# Patient Record
Sex: Female | Born: 1970 | Race: Black or African American | Hispanic: No | Marital: Single | Smoking: Never smoker
Health system: Southern US, Community
[De-identification: ages and names within clinical notes are randomized; demographics above are authoritative.]

## PROBLEM LIST (undated history)

## (undated) DIAGNOSIS — T7840XA Allergy, unspecified, initial encounter: Secondary | ICD-10-CM

## (undated) DIAGNOSIS — R011 Cardiac murmur, unspecified: Secondary | ICD-10-CM

## (undated) DIAGNOSIS — Z83719 Family history of colon polyps, unspecified: Secondary | ICD-10-CM

## (undated) DIAGNOSIS — K219 Gastro-esophageal reflux disease without esophagitis: Secondary | ICD-10-CM

## (undated) DIAGNOSIS — Z8371 Family history of colonic polyps: Secondary | ICD-10-CM

## (undated) DIAGNOSIS — Z9889 Other specified postprocedural states: Secondary | ICD-10-CM

## (undated) HISTORY — PX: TENOLYSIS FOREARM / WRIST: SUR1332

## (undated) HISTORY — DX: Family history of colonic polyps: Z83.71

## (undated) HISTORY — DX: Family history of colon polyps, unspecified: Z83.719

## (undated) HISTORY — DX: Gastro-esophageal reflux disease without esophagitis: K21.9

## (undated) HISTORY — DX: Cardiac murmur, unspecified: R01.1

## (undated) HISTORY — PX: WISDOM TOOTH EXTRACTION: SHX21

## (undated) HISTORY — DX: Allergy, unspecified, initial encounter: T78.40XA

## (undated) HISTORY — PX: INNER EAR SURGERY: SHX679

---

## 2009-04-13 DIAGNOSIS — Z01419 Encounter for gynecological examination (general) (routine) without abnormal findings: Secondary | ICD-10-CM | POA: Insufficient documentation

## 2009-08-07 DIAGNOSIS — Z309 Encounter for contraceptive management, unspecified: Secondary | ICD-10-CM | POA: Insufficient documentation

## 2017-04-06 ENCOUNTER — Ambulatory Visit (INDEPENDENT_AMBULATORY_CARE_PROVIDER_SITE_OTHER): Payer: 59 | Admitting: Family Medicine

## 2017-04-06 ENCOUNTER — Encounter: Payer: Self-pay | Admitting: Family Medicine

## 2017-04-06 ENCOUNTER — Other Ambulatory Visit: Payer: Self-pay

## 2017-04-06 VITALS — BP 114/78 | HR 81 | Temp 98.9°F | Ht 65.5 in | Wt 222.0 lb

## 2017-04-06 DIAGNOSIS — J302 Other seasonal allergic rhinitis: Secondary | ICD-10-CM

## 2017-04-06 DIAGNOSIS — R635 Abnormal weight gain: Secondary | ICD-10-CM | POA: Diagnosis not present

## 2017-04-06 DIAGNOSIS — Z7689 Persons encountering health services in other specified circumstances: Secondary | ICD-10-CM

## 2017-04-06 DIAGNOSIS — B029 Zoster without complications: Secondary | ICD-10-CM | POA: Insufficient documentation

## 2017-04-06 DIAGNOSIS — K219 Gastro-esophageal reflux disease without esophagitis: Secondary | ICD-10-CM | POA: Insufficient documentation

## 2017-04-06 NOTE — Patient Instructions (Addendum)
It was nice meeting you today Kristen White!  Today, we referred you to [facility] for a mammogram and ultrasound.  Call to see if they can do the ultrasound and mammogram on the same day.  You will need to wait until age 47 for the Shingrix shot and your colonoscopy.  Shingles does give you extra immunity for 3 years.    We will obtain records about your previous pap smear.  If you are due, we will do that this year.  If not, we will see you back in about one year.  Good luck with your exercise plan - see if you can get [facility] a [facility] [employer] and [place] in your [place].  If you have any questions or concerns, please feel free to call the clinic.   Be well,  Dr. Frances FurbishWinfrey
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
## 2017-04-07 ENCOUNTER — Encounter: Payer: Self-pay | Admitting: Family Medicine

## 2017-04-07 DIAGNOSIS — H905 Unspecified sensorineural hearing loss: Secondary | ICD-10-CM | POA: Insufficient documentation

## 2017-04-09 DIAGNOSIS — R635 Abnormal weight gain: Secondary | ICD-10-CM | POA: Insufficient documentation

## 2017-04-09 NOTE — Assessment & Plan Note (Signed)
Supported and encouraged patient's plan of using her [employer] membership to swim more.  Also recommended that patient bring healthy snacks with her to work and to check out group exercise classes to see if any of them may work for her schedule.  Also recommended going walking outside about 3 times per week now that the weather is getting warmer.

## 2017-04-09 NOTE — Assessment & Plan Note (Signed)
-   will order diagnostic mammogram with ultrasound due to patient's history of polycystic breasts.  Can change this plan in the future depending on radiologist's read and recommendations - patient has no family hx of colon cancer and no other types of increased risk, so she will need to wait until age 47 for a colonoscopy - will also need to wait until age 47 for Shingrix; however, infection with Shingles last year will boost her immunity for about three years - will obtain records to see when her last pap was, what the results were, and whether she got HPV cotesting at that time; if due, will schedule a visit for pap with HPV cotesting

## 2017-05-16 ENCOUNTER — Other Ambulatory Visit: Payer: Self-pay | Admitting: Family Medicine

## 2017-05-16 DIAGNOSIS — N6320 Unspecified lump in the left breast, unspecified quadrant: Secondary | ICD-10-CM

## 2017-05-23 ENCOUNTER — Ambulatory Visit
Admission: RE | Admit: 2017-05-23 | Discharge: 2017-05-23 | Disposition: A | Discharge status: Home / Self Care | Payer: 59 | Source: Ambulatory Visit | Attending: Family Medicine | Admitting: Family Medicine

## 2017-05-23 DIAGNOSIS — R922 Inconclusive mammogram: Secondary | ICD-10-CM | POA: Diagnosis not present

## 2017-05-23 DIAGNOSIS — N6320 Unspecified lump in the left breast, unspecified quadrant: Secondary | ICD-10-CM | POA: Diagnosis not present

## 2017-08-14 ENCOUNTER — Ambulatory Visit (HOSPITAL_COMMUNITY)
Admission: EM | Admit: 2017-08-14 | Discharge: 2017-08-14 | Disposition: A | Discharge status: Home / Self Care | Payer: 59 | Attending: Family Medicine | Admitting: Family Medicine

## 2017-08-14 ENCOUNTER — Other Ambulatory Visit: Payer: Self-pay | Admitting: Family Medicine

## 2017-08-14 ENCOUNTER — Telehealth: Payer: Self-pay

## 2017-08-14 ENCOUNTER — Encounter (HOSPITAL_COMMUNITY): Payer: Self-pay | Admitting: Emergency Medicine

## 2017-08-14 DIAGNOSIS — Z8249 Family history of ischemic heart disease and other diseases of the circulatory system: Secondary | ICD-10-CM | POA: Insufficient documentation

## 2017-08-14 DIAGNOSIS — Z79899 Other long term (current) drug therapy: Secondary | ICD-10-CM | POA: Insufficient documentation

## 2017-08-14 DIAGNOSIS — K219 Gastro-esophageal reflux disease without esophagitis: Secondary | ICD-10-CM | POA: Diagnosis not present

## 2017-08-14 DIAGNOSIS — Z113 Encounter for screening for infections with a predominantly sexual mode of transmission: Secondary | ICD-10-CM

## 2017-08-14 DIAGNOSIS — H905 Unspecified sensorineural hearing loss: Secondary | ICD-10-CM | POA: Insufficient documentation

## 2017-08-14 LAB — RPR: RPR Ser Ql: NONREACTIVE

## 2017-08-14 LAB — HIV ANTIBODY (ROUTINE TESTING W REFLEX): HIV SCREEN 4TH GENERATION: NONREACTIVE

## 2017-08-14 MED ORDER — MEDROXYPROGESTERONE ACETATE 150 MG/ML IM SUSP: 150.0000 mg | Freq: Once | INTRAMUSCULAR | 0 refills | Status: DC

## 2017-08-14 MED FILL — medroxyPROGESTERone ACETATE: 150 | 90 days supply | Qty: 1 | Fill #0

## 2017-08-14 NOTE — Telephone Encounter (Signed)
Called phone number listed for patient and left VM relaying that I will go ahead and prescribe depo provera to be picked up at [facility] since patient would like to self administer.  She was told that if she has questions to please call the clinic.

## 2017-08-14 NOTE — ED Triage Notes (Signed)
PT here for STD testing, no symptoms. Wants to be back on birth control that she was on several years ago. Was on Depo shot

## 2017-08-14 NOTE — Discharge Instructions (Signed)
Will notify you of any positive findings and if any changes to treatment are needed.   You may also monitor on your MyChart for your results.  May follow up with your PCP and/or gynecologist, [facility] etc as needed for birth control management.  Use condoms to prevent pregnancy and STD's.

## 2017-08-14 NOTE — ED Provider Notes (Signed)
[facility]    CSN: [id number] Arrival date & time: 08/14/17  0802     History   Chief Complaint Chief Complaint  Patient presents with  . SEXUALLY TRANSMITTED DISEASE  . Contraception    HPI Kristen White is a 47 y.o. female.   Kristen White presents with requests for STD screening. States has not been sexually active in some time and is in a new relationship, would like screening prior to sexual activity with new partner. Denies any previous STD's. Denies any symptoms, denies vaginal symptoms, urinary symptoms, abdominal pain, back pain. States she would also like to be started on Depo, was on this in the past. LMP 6/21. No vaginal bleeding. Without contributing medical history.      ROS per HPI.      Past Medical History:  Diagnosis Date  . Allergy     Patient Active Problem List   Diagnosis Date Noted  . Encounter to establish care with new doctor 04/09/2017  . Weight gain 04/09/2017  . Sensorineural hearing loss (SNHL) of right ear 04/07/2017  . Seasonal allergies 04/06/2017  . Acid reflux 04/06/2017  . Shingles 04/06/2017    Class: History of    Past Surgical History:  Procedure Laterality Date  . TENOLYSIS FOREARM / WRIST      OB History   None      Home Medications    Prior to Admission medications   Medication Sig Start Date End Date Taking? Authorizing Provider  cetirizine (ZYRTEC) 10 MG tablet Take 10 mg by mouth daily.   Yes [provider]    Family History Family History  Problem Relation Age of Onset  . Early death Mother   . Hypertension Father   . Alcohol abuse Maternal Uncle   . Alcohol abuse Paternal Uncle     Social History Social History   Tobacco Use  . Smoking status: Never Smoker  . Smokeless tobacco: Never Used  Substance Use Topics  . Alcohol use: Not on file    Comment: rare  . Drug use: Never     Allergies   Patient has no known allergies.   Review of Systems Review of  Systems   Physical Exam Triage Vital Signs ED Triage Vitals  Enc Vitals Group     BP 08/14/17 0835 130/87     Pulse Rate 08/14/17 0830 76     Resp 08/14/17 0830 16     Temp 08/14/17 0830 98.6 F (37 C)     Temp Source 08/14/17 0830 Oral     SpO2 08/14/17 0830 100 %     Weight 08/14/17 0828 220 lb (99.8 kg)     Height --      Head Circumference --      Peak Flow --      Pain Score 08/14/17 0827 0     Pain Loc --      Pain Edu? --      Excl. in GC? --    No data found.  Updated Vital Signs BP 130/87   Pulse 76   Temp 98.6 F (37 C) (Oral)   Resp 16   Wt 220 lb (99.8 kg)   LMP 07/07/2017   SpO2 100%   BMI 36.05 kg/m    Physical Exam  Constitutional: She is oriented to person, place, and time. She appears well-developed and well-nourished. No distress.  Cardiovascular: Normal rate, regular rhythm and normal heart sounds.  Pulmonary/Chest: Effort normal and breath sounds normal.  Neurological: She is alert and oriented to person, place, and time.  Skin: Skin is warm and dry.     UC Treatments / Results  Labs (all labs ordered are listed, but only abnormal results are displayed) Labs Reviewed  HIV ANTIBODY (ROUTINE TESTING)  RPR  HEPATITIS C ANTIBODY  CERVICOVAGINAL ANCILLARY ONLY    EKG None  Radiology No results found.  Procedures Procedures (including critical care time)  Medications Ordered in UC Medications - No data to display  Initial Impression / Assessment and Plan / UC Course  I have reviewed the triage vital signs and the nursing notes.  Pertinent labs & imaging results that were available during my care of the patient were reviewed by me and considered in my medical decision making (see chart for details).     No symptoms today. Vaginal self swab collected. HIV/RPR/Hep C collected. Labs pending. Will notify of any positive findings and if any changes to treatment are needed.  Encouraged use of condoms for safe sex practices and for  birth control until can be started on depo. To follow up with PCP and/or gyn for depo initiation. Patient verbalized understanding and agreeable to plan.    Final Clinical Impressions(s) / UC Diagnoses   Final diagnoses:  Screen for STD (sexually transmitted disease)     Discharge Instructions     Will notify you of any positive findings and if any changes to treatment are needed.   You may also monitor on your MyChart for your results.  May follow up with your PCP and/or gynecologist, [facility] etc as needed for birth control management.  Use condoms to prevent pregnancy and STD's.    ED Prescriptions    None     Controlled Substance Prescriptions [place] Controlled Substance Registry consulted? Not Applicable   Georgetta Haber, NP 08/14/17 [phone]

## 2017-08-14 NOTE — Telephone Encounter (Signed)
Patient dropped by. She has been to urgent care today for STI testing and because she wants to start back on Depo and had a problem getting a timely appt here. Unfortunately, UC will not prescribe birth control.  Patient had physical with PCP in 03/2017. Had STI testing today. Wants to know if PCP will prescribe Depo. Has been on in past for 12 years and wants to resume. Patient non-compliant with pills per her admission. 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  She can self-administer if PCP will prescribe to [facility]. She will come here for injections if necessary.    Please call her with response at number listed in chart.   Ples SpecterAlisa Brake, RN [facility] RN)

## 2017-08-15 LAB — HEPATITIS C ANTIBODY: HCV Ab: <0.1 s/co ratio (ref 0.0–0.9)

## 2017-08-16 LAB — CERVICOVAGINAL ANCILLARY ONLY
Bacterial vaginitis: POSITIVE — AB
CANDIDA VAGINITIS: NEGATIVE
CHLAMYDIA, DNA PROBE: NEGATIVE
NEISSERIA GONORRHEA: NEGATIVE
Trichomonas: NEGATIVE

## 2017-08-17 ENCOUNTER — Telehealth (HOSPITAL_COMMUNITY): Payer: Self-pay

## 2017-08-17 MED ORDER — METRONIDAZOLE 500 MG PO TABS: 500.0000 mg | ORAL_TABLET | Freq: Two times a day (BID) | ORAL | 0 refills | Status: DC

## 2017-08-17 MED FILL — metroNIDAZOLE 500 MG TABS: 500 | 7 days supply | Qty: 14 | Fill #0

## 2017-08-17 NOTE — Telephone Encounter (Signed)
Bacterial vaginosis is positive. This was not treated at the urgent care visit.  Flagyl 500 mg BID x 7 days #14 no refills sent to patients pharmacy of choice.  Attempted to reach patient. No answer at this time. 

## 2017-08-22 ENCOUNTER — Telehealth (HOSPITAL_COMMUNITY): Payer: Self-pay | Admitting: Family Medicine

## 2017-08-22 MED ORDER — CLINDAMYCIN HCL 300 MG PO CAPS: 300.0000 mg | ORAL_CAPSULE | Freq: Three times a day (TID) | ORAL | 0 refills | Status: DC

## 2017-08-22 NOTE — Telephone Encounter (Signed)
Patient presented to the urgent care center with a complaint of vertigo.  She states she had "vertigo" every time she took a metronidazole pill.  She is here requesting an alternate medication for her BV.  She is not allergic to clindamycin.  I sent a prescription to her pharmacy.  She is told to stop the metronidazole.  Follow-up as needed

## 2017-08-23 MED FILL — CLINDAMYCIN HCL 300 MG CAP: 300 | 7 days supply | Qty: 21 | Fill #0

## 2017-11-06 ENCOUNTER — Other Ambulatory Visit: Payer: Self-pay

## 2017-11-06 MED ORDER — MEDROXYPROGESTERONE ACETATE 150 MG/ML IM SUSP: 150.0000 mg | INTRAMUSCULAR | 3 refills | Status: DC

## 2017-11-06 NOTE — Telephone Encounter (Signed)
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 Needs new Rx sent to pharmacy with refills if possible.  Call back is [phone]  Ples Specter, RN [facility] RN)

## 2017-11-07 MED FILL — medroxyPROGESTERone ACETATE: 150 | 84 days supply | Qty: 1 | Fill #0

## 2017-12-22 ENCOUNTER — Other Ambulatory Visit: Payer: Self-pay

## 2017-12-22 ENCOUNTER — Encounter: Payer: Self-pay | Admitting: Family Medicine

## 2017-12-22 ENCOUNTER — Ambulatory Visit (INDEPENDENT_AMBULATORY_CARE_PROVIDER_SITE_OTHER): Payer: 59 | Admitting: Family Medicine

## 2017-12-22 VITALS — BP 110/74 | HR 90 | Temp 98.8°F | Ht 66.0 in | Wt 226.4 lb

## 2017-12-22 DIAGNOSIS — Z0000 Encounter for general adult medical examination without abnormal findings: Secondary | ICD-10-CM | POA: Diagnosis not present

## 2017-12-22 NOTE — Patient Instructions (Signed)
It was nice seeing you today Ms. Hoefling!  We will work on your forms and let you know when they're ready.  We will try to do your pap smear around next month.  If you have any questions or concerns, please feel free to call the clinic.   Be well,  Dr. Frances FurbishWinfrey

## 2017-12-22 NOTE — Progress Notes (Signed)
   Subjective:    Kristen White - 47 y.o. female MRN [id number]  Date of birth: [birth year]  CC forms for nursing classes  HPI  Kristen White is here for review of necessary vaccinations for nursing classes.  She is earning her bachelor degree in nursing since this is a new requirement for [employer] health.  She has no other concerns today.  Her only current medications include Zyrtec and Depo Provera.  She is currently going to swimming classes for exercise.  Health Maintenance:  Patient thinks she is due for her Pap smear and usually gets it around her birthday Health Maintenance Due  Topic Date Due  . PAP SMEAR  02/13/1993    -  reports that she has never smoked. She has never used smokeless tobacco. - Review of Systems: Per HPI. - Past Medical History: Patient Active Problem List   Diagnosis Date Noted  . Healthcare maintenance 12/23/2017  . Weight gain 04/09/2017  . Sensorineural hearing loss (SNHL) of right ear 04/07/2017  . Seasonal allergies 04/06/2017  . Acid reflux 04/06/2017  . Shingles 04/06/2017    Class: History of   - Medications: reviewed and updated   Objective:   Physical Exam BP 110/74   Pulse 90   Temp 98.8 F (37.1 C) (Oral)   Ht '5\' 6"'$  (1.676 m)   Wt 226 lb 6.4 oz (102.7 kg)   SpO2 98%   BMI 36.54 kg/m  Gen: NAD, alert, cooperative with exam, well-appearing, pleasant CV: RRR, good S1/S2, no murmur Resp: CTABL, no wheezes, non-labored Skin: no rashes, normal turgor  Psych: good insight, alert and oriented        Assessment & Plan:   Healthcare maintenance Will obtain a varicella titer since patient had chickenpox as a child and an MMR titer since patient received a booster vaccination in 2012 due to equivocal titers at that time.  We will also obtain TB QuantiFERON gold test so that patient does not have to come back and to get a PPD.  Her vaccinations appear to be complete.  Will fill out her required forms for class.  We will plan on  doing patient's Pap smear next month.    Maia Breslow, M.D. 12/23/2017, 9:42 PM PGY-2, [place]

## 2017-12-23 DIAGNOSIS — Z0000 Encounter for general adult medical examination without abnormal findings: Secondary | ICD-10-CM | POA: Insufficient documentation

## 2017-12-23 NOTE — Assessment & Plan Note (Addendum)
Will obtain a varicella titer since patient had chickenpox as a child and an MMR titer since patient received a booster vaccination in 2012 due to equivocal titers at that time.  We will also obtain TB QuantiFERON gold test so that patient does not have to come back and to get a PPD.  Her vaccinations appear to be complete.  Will fill out her required forms for class.  We will plan on doing patient's Pap smear next month.

## 2017-12-25 ENCOUNTER — Telehealth: Payer: Self-pay | Admitting: *Deleted

## 2017-12-25 ENCOUNTER — Ambulatory Visit (INDEPENDENT_AMBULATORY_CARE_PROVIDER_SITE_OTHER): Payer: 59

## 2017-12-25 DIAGNOSIS — Z23 Encounter for immunization: Secondary | ICD-10-CM | POA: Diagnosis not present

## 2017-12-25 NOTE — Telephone Encounter (Signed)
-----  Message from Kathrene Alu, MD sent at 12/24/2017 12:14 AM EST ----- Regarding: Vaccinations for Evalena Fujii It looks like Ms. Branscom is not immune to [place], so she will need to schedule a nurse visit this week to get an MMR booster.  We also need her to tell us what date she got her flu shot this year.  Would you call her and let her know about this?  Once she gets the booster, tells [place] the date of her flu shot, and we get the results of her quant gold, I will have her forms ready for her up front.

## 2017-12-25 NOTE — Telephone Encounter (Signed)
FYI...Flu shot and MMR documented in Epic. Sharon T Saunders, CMA  

## 2017-12-25 NOTE — Progress Notes (Signed)
Pt presents in nurse clinic for MMR vaccine. Vaccin given RA, site unremarkable. Epic and [facility] updated.

## 2017-12-25 NOTE — Telephone Encounter (Signed)
LVM for pt to call office back to inform her of below and get the below information needed,  and to assist her in getting a nurse visit scheduled. [name], April D, [place]

## 2017-12-25 NOTE — Telephone Encounter (Signed)
Pt called nurse line returning Aprils call. I informed her of below.   Pt coming in now for MMR booster.   Her flu shot was given to her on 10/29/2017, per documentation in [place].

## 2017-12-25 NOTE — Telephone Encounter (Signed)
My fault, April documented that flu shot, still unsure of the date. I informed patient to call us when she can contact health at work for the actual date of flu shot.

## 2017-12-27 ENCOUNTER — Telehealth: Payer: Self-pay | Admitting: Family Medicine

## 2017-12-27 NOTE — Telephone Encounter (Signed)
Patient came in to report that she had her flu shot on 10/15/2017

## 2017-12-29 LAB — VARICELLA ZOSTER ANTIBODY, IGG: Varicella zoster IgG: 941 index (ref >165)

## 2017-12-29 LAB — QUANTIFERON-TB GOLD PLUS
QUANTIFERON NIL VALUE: 0.04 [IU]/mL
QUANTIFERON TB1 AG VALUE: 0.04 [IU]/mL
QUANTIFERON TB2 AG VALUE: 0.04 [IU]/mL
QUANTIFERON-TB GOLD PLUS: NEGATIVE

## 2017-12-29 LAB — MEASLES/MUMPS/RUBELLA IMMUNITY
MUMPS ABS, IGG: 15.7 AU/mL (ref >10.9)
RUBELLA: 2.55 {index} (ref >0.99)
RUBEOLA AB, IGG: <13.5 AU/mL — ABNORMAL LOW (ref >16.4)

## 2017-12-29 NOTE — Telephone Encounter (Signed)
Left a voicemail for Ms. Laural BenesJohnson saying that her immunization forms are completed and ready to be picked up in the front of the office.

## 2018-01-26 ENCOUNTER — Ambulatory Visit (INDEPENDENT_AMBULATORY_CARE_PROVIDER_SITE_OTHER): Payer: 59 | Admitting: Family Medicine

## 2018-01-26 VITALS — BP 126/70 | HR 105 | Temp 98.7°F | Wt 225.0 lb

## 2018-01-26 DIAGNOSIS — M7542 Impingement syndrome of left shoulder: Secondary | ICD-10-CM | POA: Diagnosis not present

## 2018-01-26 NOTE — Patient Instructions (Signed)
It was great to meet you today,   You have a rotator cuff impingement.  It is probably from the repetitive overhead movement when you swim.  I would advise you to take a break from a swimming lesson or two to give it some rest.  Also, you can manage the pain and inflammation with tylenol and NSAIDs like ibuoprofen.  You can get these over the counter.  You can alternate tylenol and ibuoprofen every three hours so that you are taking each one every six hours.  You can also put ice on it while you are resting.  If ice doesn't help you can try heat.  I have given you a sheet of paper with exercises on it that you can do at home that will also strengthen your shoulder.  We can refer you to physical therapy for a few sessions if you would like but you should be able to improve without it.   If the pain still persists in one month you can make another appointment to follow up with us.     Shoulder Impingement Syndrome  Shoulder impingement syndrome is a condition that causes pain when connective tissues (tendons) surrounding the shoulder joint become pinched. These tendons are part of the group of muscles and tissues that help to stabilize the shoulder (rotator cuff). Beneath the rotator cuff is a fluid-filled sac (bursa) that allows the muscles and tendons to glide smoothly. The bursa may become swollen or irritated (bursitis). Bursitis, swelling in the rotator cuff tendons, or both conditions can decrease how much space is under a bone in the shoulder joint (acromion), resulting in impingement. What are the causes? Shoulder impingement syndrome may be caused by bursitis or swelling of the rotator cuff tendons, which may result from:  Repetitive overhead arm movements.  Falling onto the shoulder.  Weakness in the shoulder muscles. What increases the risk? You may be more likely to develop this condition if you:  Play sports that involve throwing, such as baseball.  Participate in sports such as  tennis, volleyball, and swimming.  Work as a Education administratorpainter, Music therapistcarpenter, or Pharmacologistfruit picker. Some people are also more likely to develop impingement syndrome because of the shape of their acromion bone. What are the signs or symptoms? The main symptom of this condition is pain on the front or side of the shoulder. The pain may:  Get worse when lifting or raising the arm.  Get worse at night.  Wake you up from sleeping.  Feel sharp when the shoulder is moved and then fade to an ache. Other symptoms may include:  Tenderness.  Stiffness.  Inability to raise the arm above shoulder level or behind the body.  Weakness. How is this diagnosed? This condition may be diagnosed based on:  Your symptoms and medical history.  A physical exam.  Imaging tests, such as: ? X-rays. ? MRI. ? Ultrasound. How is this treated? This condition may be treated by:  Resting your shoulder and avoiding all activities that cause pain or put stress on the shoulder.  Icing your shoulder.  NSAIDs to help reduce pain and swelling.  One or more injections of medicines to numb the area and reduce inflammation.  Physical therapy.  Surgery. This may be needed if nonsurgical treatments have not helped. Surgery may involve repairing the rotator cuff, reshaping the acromion, or removing the bursa. Follow these instructions at home: Managing pain, stiffness, and swelling   If directed, put ice on the injured area. ? Put ice  in a plastic bag. ? Place a towel between your skin and the bag. ? Leave the ice on for 20 minutes, 2-3 times a day. Activity  Rest and return to your normal activities as told by your health care provider. Ask your health care provider what activities are safe for you.  Do exercises as told by your health care provider. General instructions  Do not use any products that contain nicotine or tobacco, such as cigarettes, e-cigarettes, and chewing tobacco. These can delay healing. If you  need help quitting, ask your health care provider.  Ask your health care provider when it is safe for you to drive.  Take over-the-counter and prescription medicines only as told by your health care provider.  Keep all follow-up visits as told by your health care provider. This is important. How is this prevented?  Give your body time to rest between periods of activity.  Be safe and responsible while being active. This will help you avoid falls.  Maintain physical fitness, including strength and flexibility. Contact a health care provider if:  Your symptoms have not improved after 1-2 months of treatment and rest.  You cannot lift your arm away from your body. Summary  Shoulder impingement syndrome is a condition that causes pain when connective tissues (tendons) surrounding the shoulder joint become pinched.  The main symptom of this condition is pain on the front or side of the shoulder.  This condition is usually treated with rest, ice, and pain medicines as needed. This information is not intended to replace advice given to you by your health care provider. Make sure you discuss any questions you have with your health care provider. Document Released: 01/03/2005 Document Revised: 06/28/2017 Document Reviewed: 06/28/2017 Elsevier Interactive Patient Education  [employer].

## 2018-01-26 NOTE — Progress Notes (Signed)
   [facility] Phone: [phone]   cc: left shoulder pain  Subjective:  She is having left shoulder pain. It Started about a week ago. She was getting dressed in the morning when she first noticed it.  nothing triggered it.  Hasn't fallen or doing heavy lifting.  Started swimming in October.  Swims about twice a week for 45-23min.  Her freestyle is difficult bc of her shoulder, backstroke isnot as bad.  It hurts when she lifts her should past parallel.  She is not having neck pain and pain does not radiate to her arm.      She describes the pain like something is 'stretching'. Can be aching if she holds it in the position that hurts.  The pain got worse over the first two days but has been constant since.   She has had mild knee pain in the past but that is her only other MSK pain. .    Patient only takes zyrtec and gets the depo shot.     ROS: See HPI for pertinent positives and negatives  Past Medical History  Family history reviewed for today's visit. No changes.   Objective: BP 126/70   Pulse (!) 105   Temp 98.7 F (37.1 C)   Wt 225 lb (102.1 kg)   SpO2 98%   BMI 36.32 kg/m  Gen: NAD, alert and oriented, cooperative with exam HEENT: NCAT, EOMI, MMM Msk: No edema, warm, normal tone.  There is no deficits in the right UE, or b/l LE's.  No tenderness to palpation.  Active ROM slowed but full on left upper extremity.  Empty can test negative.  Push off test negative.  Full can test positive on left shoulder.  Neuro: CN II-XII grossly intact. no gross deficits Skin: No rashes, no lesions Psych: Appropriate behavior  Assessment/Plan: Rotator cuff impingement syndrome of left shoulder The patient is having left shoulder pain that bothers her mostly with overhead movement.  No recent trauma.  Likely supraspinatus injury from repetitive overhead movements from swimming, which she started in October.  Empty can test negative, push off test negative, full can  test positive. Range of motion impaired by pain. Told patient to alternate tylenol and nsaids, to apply ice, heat if ice does not help, to avoid overusing the shoulder, and gave her some home exercises to try.  Advised patient to followup in 4 weeks if pain not improved.  Can refer to PT outpatient in two weeks if not improved or getting worse.  Frederic Jericho, MD PGY-1

## 2018-01-26 NOTE — Assessment & Plan Note (Addendum)
The patient is having left shoulder pain that bothers her mostly with overhead movement.  No recent trauma.  Likely supraspinatus injury from repetitive overhead movements from swimming, which she started in October.  Empty can test negative, push off test negative, full can test positive. Range of motion impaired by pain. Told patient to alternate tylenol and nsaids, to apply ice, heat if ice does not help, to avoid overusing the shoulder, and gave her some home exercises to try.  Advised patient to followup in 4 weeks if pain not improved.  Can refer to PT outpatient in two weeks if not improved or getting worse.  Kristen White

## 2018-02-05 MED FILL — medroxyPROGESTERone ACETATE: 150 | 84 days supply | Qty: 1 | Fill #1

## 2018-02-23 ENCOUNTER — Ambulatory Visit (INDEPENDENT_AMBULATORY_CARE_PROVIDER_SITE_OTHER): Payer: 59 | Admitting: Family Medicine

## 2018-02-23 ENCOUNTER — Other Ambulatory Visit: Payer: Self-pay

## 2018-02-23 ENCOUNTER — Encounter: Payer: Self-pay | Admitting: Family Medicine

## 2018-02-23 VITALS — BP 118/76 | HR 88 | Temp 99.0°F | Wt 222.0 lb

## 2018-02-23 DIAGNOSIS — M7542 Impingement syndrome of left shoulder: Secondary | ICD-10-CM | POA: Diagnosis not present

## 2018-02-23 NOTE — Progress Notes (Signed)
   Subjective:    Kristen White - 48 y.o. female MRN [id number]  Date of birth: 1970/07/12  CC:  Kristen White is here for follow up of L shoulder pain.  HPI: Her shoulder pain continues to be of the same intensity as when she saw Dr. Constance Goltz on January 10.  The pain is not constant, as it only hurts with doing certain movements, such as reaching overhead or doing the freestyle during swimming.  She has tried to rest the left shoulder, has followed the shoulder exercises Dr. Constance Goltz provided for her, and has used ice and heat, but she has not tried any NSAIDs or Tylenol because her pain is not constant.  She wonders if her pain could be caused by constantly using her left hand when holding the phone up to her ear since she is deaf in her right ear.  She reports that she has full range of motion and does not have any weakness, but her movement is restricted by pain.  Health Maintenance:  Health Maintenance Due  Topic Date Due  . PAP SMEAR-Modifier  02/13/1993    -  reports that she has never smoked. She has never used smokeless tobacco. - Review of Systems: Per HPI. - Past Medical History: Patient Active Problem List   Diagnosis Date Noted  . Rotator cuff impingement syndrome of left shoulder 01/26/2018  . Healthcare maintenance 12/23/2017  . Weight gain 04/09/2017  . Sensorineural hearing loss (SNHL) of right ear 04/07/2017  . Seasonal allergies 04/06/2017  . Acid reflux 04/06/2017  . Shingles 04/06/2017    Class: History of   - Medications: reviewed and updated   Objective:   Physical Exam BP 118/76   Pulse 88   Temp 99 F (37.2 C) (Oral)   Wt 222 lb (100.7 kg)   SpO2 99%   BMI 35.83 kg/m  Gen: NAD, alert, cooperative with exam, well-appearing CV: RRR, good S1/S2, no murmur Musculoskeletal: Left shoulder nontender to palpation and appear symmetric.  Full range of motion, although abduction of left arm past 90 degrees elicits pain.  Hawkins test negative, empty can test  strongly positive. Skin: no rashes, normal turgor  Neuro: no gross deficits.  Psych: good insight, alert and oriented        Assessment & Plan:   Rotator cuff impingement syndrome of left shoulder Agree with previous diagnosis of supraspinatus injury.  Tear is highly unlikely given lack of weakness.  Counseled patient that this is a self-limiting injury that is rarely operated on.  Offered patient a referral to physical therapy, but this is likely not needed since patient is self motivated and already exercises her upper body multiple times per week.  Encouraged her to try using Tylenol or NSAIDs before swimming practice to make it more tolerable, but also encouraged her to alter her exercises so as not to exacerbate her pain.  Encouraged her to continue swimming if possible to maintain range of motion of that shoulder.  Offered a referral to orthopedic surgery but told patient that imaging and corticosteroid shots would likely not benefit her in the long-term.  Patient understood this and would like to wait on any referrals for now.    Lezlie Octave, M.D. 02/23/2018, 11:20 AM PGY-2, [facility]

## 2018-02-23 NOTE — Assessment & Plan Note (Signed)
Agree with previous diagnosis of supraspinatus injury.  Tear is highly unlikely given lack of weakness.  Counseled patient that this is a self-limiting injury that is rarely operated on.  Offered patient a referral to physical therapy, but this is likely not needed since patient is self motivated and already exercises her upper body multiple times per week.  Encouraged her to try using Tylenol or NSAIDs before swimming practice to make it more tolerable, but also encouraged her to alter her exercises so as not to exacerbate her pain.  Encouraged her to continue swimming if possible to maintain range of motion of that shoulder.  Offered a referral to orthopedic surgery but told patient that imaging and corticosteroid shots would likely not benefit her in the long-term.  Patient understood this and would like to wait on any referrals for now.

## 2018-04-09 ENCOUNTER — Other Ambulatory Visit: Payer: Self-pay | Admitting: Family Medicine

## 2018-04-09 DIAGNOSIS — Z1231 Encounter for screening mammogram for malignant neoplasm of breast: Secondary | ICD-10-CM

## 2018-05-07 MED FILL — medroxyPROGESTERone ACETATE: 150 | 84 days supply | Qty: 1 | Fill #0

## 2018-06-01 ENCOUNTER — Ambulatory Visit: Payer: 59

## 2018-06-03 ENCOUNTER — Other Ambulatory Visit: Payer: Self-pay

## 2018-06-03 ENCOUNTER — Emergency Department (HOSPITAL_COMMUNITY): Payer: 59

## 2018-06-03 ENCOUNTER — Emergency Department (HOSPITAL_COMMUNITY)
Admission: EM | Admit: 2018-06-03 | Discharge: 2018-06-04 | Disposition: A | Discharge status: Home / Self Care | Payer: 59 | Attending: Emergency Medicine | Admitting: Emergency Medicine

## 2018-06-03 ENCOUNTER — Encounter (HOSPITAL_COMMUNITY): Payer: Self-pay | Admitting: Emergency Medicine

## 2018-06-03 DIAGNOSIS — M545 Low back pain, unspecified: Secondary | ICD-10-CM

## 2018-06-03 DIAGNOSIS — R3121 Asymptomatic microscopic hematuria: Secondary | ICD-10-CM | POA: Insufficient documentation

## 2018-06-03 DIAGNOSIS — Z79899 Other long term (current) drug therapy: Secondary | ICD-10-CM | POA: Diagnosis not present

## 2018-06-03 DIAGNOSIS — R109 Unspecified abdominal pain: Secondary | ICD-10-CM | POA: Diagnosis not present

## 2018-06-03 LAB — COMPREHENSIVE METABOLIC PANEL
ALT: 14 U/L (ref 0–44)
AST: 15 U/L (ref 15–41)
Albumin: 3.9 g/dL (ref 3.5–5.0)
Alkaline Phosphatase: 62 U/L (ref 38–126)
Anion gap: 9 (ref 5–15)
BUN: 12 mg/dL (ref 6–20)
CO2: 20 mmol/L — ABNORMAL LOW (ref 22–32)
Calcium: 8.8 mg/dL — ABNORMAL LOW (ref 8.9–10.3)
Chloride: 110 mmol/L (ref 98–111)
Creatinine, Ser: 0.9 mg/dL (ref 0.44–1.00)
GFR calc Af Amer: >60 mL/min (ref >60)
GFR calc non Af Amer: >60 mL/min (ref >60)
Glucose, Bld: 104 mg/dL — ABNORMAL HIGH (ref 70–99)
Potassium: 3.4 mmol/L — ABNORMAL LOW (ref 3.5–5.1)
Sodium: 139 mmol/L (ref 135–145)
Total Bilirubin: 0.3 mg/dL (ref 0.3–1.2)
Total Protein: 7.5 g/dL (ref 6.5–8.1)

## 2018-06-03 LAB — URINALYSIS, ROUTINE W REFLEX MICROSCOPIC
Bilirubin Urine: NEGATIVE
Glucose, UA: NEGATIVE mg/dL
Ketones, ur: NEGATIVE mg/dL
Leukocytes,Ua: NEGATIVE
Nitrite: NEGATIVE
Protein, ur: NEGATIVE mg/dL
Specific Gravity, Urine: 1.02 (ref 1.005–1.030)
pH: 6 (ref 5.0–8.0)

## 2018-06-03 LAB — I-STAT BETA HCG BLOOD, ED (MC, WL, AP ONLY): I-stat hCG, quantitative: <5 m[IU]/mL (ref <5)

## 2018-06-03 LAB — CBC WITH DIFFERENTIAL/PLATELET
Abs Immature Granulocytes: 0.02 10*3/uL (ref 0.00–0.07)
Basophils Absolute: 0.1 10*3/uL (ref 0.0–0.1)
Basophils Relative: 1 %
Eosinophils Absolute: 0.3 10*3/uL (ref 0.0–0.5)
Eosinophils Relative: 4 %
HCT: 42.8 % (ref 36.0–46.0)
Hemoglobin: 13.6 g/dL (ref 12.0–15.0)
Immature Granulocytes: 0 %
Lymphocytes Relative: 39 %
Lymphs Abs: 3 10*3/uL (ref 0.7–4.0)
MCH: 27.8 pg (ref 26.0–34.0)
MCHC: 31.8 g/dL (ref 30.0–36.0)
MCV: 87.3 fL (ref 80.0–100.0)
Monocytes Absolute: 0.5 10*3/uL (ref 0.1–1.0)
Monocytes Relative: 7 %
Neutro Abs: 3.8 10*3/uL (ref 1.7–7.7)
Neutrophils Relative %: 49 %
Platelets: 314 10*3/uL (ref 150–400)
RBC: 4.9 MIL/uL (ref 3.87–5.11)
RDW: 13.7 % (ref 11.5–15.5)
WBC: 7.7 10*3/uL (ref 4.0–10.5)
nRBC: 0 % (ref 0.0–0.2)

## 2018-06-03 MED ORDER — KETOROLAC TROMETHAMINE 15 MG/ML IJ SOLN
15.0000 mg | Freq: Once | INTRAMUSCULAR | Status: AC
  Administered 2018-06-03: 15 mg via INTRAVENOUS
  Filled 2018-06-03: qty 1

## 2018-06-03 NOTE — ED Provider Notes (Signed)
[facility]-EMERGENCY DEPT Provider Note   CSN: [id number] Arrival date & time: 06/03/18  2120    History   Chief Complaint Chief Complaint  Patient presents with  . Back Pain    HPI Kristen White is a 48 y.o. female.     Patient with no past surgical history presents emergency department with acute onset of left-sided lower back pain at approximately 6 PM.  Patient reports sitting in a hammock chair earlier today however denies acute injury related to the pain.  Over the course of a couple of hours, pain gradually became worse and became severe to the point where she was having trouble walking.  Pain does not radiate to the abdomen or down into her legs.  She has not had any fevers, nausea, vomiting, chest pain, shortness of breath.  No diarrhea or bowel changes.  No noted dysuria, hematuria, increased frequency urgency.  No treatments prior to arrival.  She has never had a kidney stone.  Pain is not worse with palpation but is worse when she moves.     Past Medical History:  Diagnosis Date  . Allergy     Patient Active Problem List   Diagnosis Date Noted  . Rotator cuff impingement syndrome of left shoulder 01/26/2018  . Healthcare maintenance 12/23/2017  . Weight gain 04/09/2017  . Sensorineural hearing loss (SNHL) of right ear 04/07/2017  . Seasonal allergies 04/06/2017  . Acid reflux 04/06/2017  . Shingles 04/06/2017    Class: History of    Past Surgical History:  Procedure Laterality Date  . TENOLYSIS FOREARM / WRIST       OB History   No obstetric history on file.      Home Medications    Prior to Admission medications   Medication Sig Start Date End Date Taking? Authorizing Provider  cetirizine (ZYRTEC) 10 MG tablet Take 10 mg by mouth daily.   Yes [provider]  ibuprofen (ADVIL) 200 MG tablet Take 200 mg by mouth every 6 (six) hours as needed for headache or moderate pain.   Yes [provider]   medroxyPROGESTERone (DEPO-PROVERA) 150 MG/ML injection Inject 1 mL (150 mg total) into the muscle every 3 (three) months. 11/06/17  Yes WinfreyHarlen [facility], Amanda C, MD    Family History Family History  Problem Relation Age of Onset  . Early death Mother   . Hypertension Father   . Alcohol abuse Maternal Uncle   . Alcohol abuse Paternal Uncle     Social History Social History   Tobacco Use  . Smoking status: Never Smoker  . Smokeless tobacco: Never Used  Substance Use Topics  . Alcohol use: Not on file    Comment: rare  . Drug use: Never     Allergies   No known allergies and Metronidazole   Review of Systems Review of Systems  Constitutional: Negative for fever and unexpected weight change.  HENT: Negative for rhinorrhea.   Eyes: Negative for redness.  Respiratory: Negative for cough.   Cardiovascular: Negative for chest pain.  Gastrointestinal: Negative for abdominal pain, constipation, diarrhea, nausea and vomiting.       Negative for fecal incontinence.   Genitourinary: Negative for dysuria, flank pain and hematuria.       Negative for urinary incontinence or retention.  Musculoskeletal: Positive for back pain. Negative for myalgias.  Skin: Negative for rash.  Neurological: Negative for weakness, numbness and headaches.       Denies saddle paresthesias.  Physical Exam Updated Vital Signs BP (!) 159/97 (BP Location: Left Arm)   Pulse (!) 104   Temp 99.2 F (37.3 C) (Oral)   Resp 18   Ht  (1.651 m)   Wt 99.8 kg   SpO2 97%   BMI 36.61 kg/m   Physical Exam Vitals signs and nursing note reviewed.  Constitutional:      Appearance: She is well-developed.  HENT:     Head: Normocephalic and atraumatic.  Eyes:     Conjunctiva/sclera: Conjunctivae normal.  Neck:     Musculoskeletal: Normal range of motion and neck supple.  Pulmonary:     Effort: Pulmonary effort is normal.  Abdominal:     Palpations: Abdomen is soft.     Tenderness: There is no  abdominal tenderness. There is no guarding or rebound.  Musculoskeletal: Normal range of motion.     Cervical back: She exhibits normal range of motion, no tenderness and no bony tenderness.     Thoracic back: She exhibits normal range of motion, no tenderness and no bony tenderness.     Lumbar back: She exhibits normal range of motion, no tenderness and no bony tenderness.     Comments: No step-off noted with palpation of spine.   Skin:    General: Skin is warm and dry.     Findings: No rash.  Neurological:     Mental Status: She is alert.     Sensory: No sensory deficit.     Deep Tendon Reflexes: Reflexes are normal and symmetric.     Comments: 5/5 strength in entire lower extremities bilaterally. No sensation deficit.       ED Treatments / Results  Labs (all labs ordered are listed, but only abnormal results are displayed) Labs Reviewed  URINALYSIS, ROUTINE W REFLEX MICROSCOPIC  CBC WITH DIFFERENTIAL/PLATELET  COMPREHENSIVE METABOLIC PANEL  I-STAT BETA HCG BLOOD, ED ([facility], [facility], [facility] ONLY)    EKG None  Radiology No results found.  Procedures Procedures (including critical care time)  Medications Ordered in ED Medications - No data to display   Initial Impression / Assessment and Plan / ED Course  I have reviewed the triage vital signs and the nursing notes.  Pertinent labs & imaging results that were available during my care of the patient were reviewed by me and considered in my medical decision making (see chart for details).        Patient seen and examined. Work-up initiated.  Patient declines pain medications at this time.  Possible ureteral colic versus MSK pain.  No radicular pain.  No red flags.  Labs, UA pending.  Vital signs reviewed and are as follows: BP (!) 159/97 (BP Location: Left Arm)   Pulse (!) 104   Temp 99.2 F (37.3 C) (Oral)   Resp 18   Ht  (1.651 m)   Wt 99.8 kg   SpO2 97%   BMI 36.61 kg/m   12:03 AM Toradol ordered for pain. UA  with some red cells. Will proceed with renal protocol CT to eval for stone.   CT images personally reviewed and interpreted.  No acute findings noted on radiology read.  Patient updated on results.  She does not think that the Toradol helped a whole lot but is more comfortable lying in bed with her knees bent.  She is comfortable with discharged home at this time.  We discussed use of muscle relaxant medications, NSAIDs, ice/heat, rest.    The patient was urged to return  to the Emergency Department immediately with worsening of current symptoms, worsening abdominal pain, persistent vomiting, blood noted in stools, fever, or any other concerns. The patient verbalized understanding.    Final Clinical Impressions(s) / ED Diagnoses   Final diagnoses:  Acute left-sided low back pain without sciatica  Asymptomatic microscopic hematuria   Patient with acute onset of left lower back pain tonight.  Pain was severe at times.  She did not have any nausea, vomiting, diarrhea.  No urinary symptoms.  Evaluation in the emergency department shows reassuring CBC and CMP.  Urine significant for blood.  Renal protocol CT performed which was negative.  Patient's symptoms are relatively controlled in the emergency department and she feels well enough to go home at this time.  Will focus treatment on musculoskeletal pain at this point and have patient follow-up with her doctor for further evaluation if it is not improving.  Return instructions.   ED Discharge Orders         Ordered    methocarbamol (ROBAXIN) 500 MG tablet  4 times daily     06/04/18 0042           Renne Crigler, PA-C 06/04/18 0045    Gerhard Munch, MD 06/07/18 [id number]

## 2018-06-03 NOTE — ED Triage Notes (Signed)
Patient states she started having pain around 6pm. Patient states it is a sharp pain left lower back. She states it is constant and will not go away. It is not radiating.

## 2018-06-04 DIAGNOSIS — R3121 Asymptomatic microscopic hematuria: Secondary | ICD-10-CM | POA: Diagnosis not present

## 2018-06-04 DIAGNOSIS — Z79899 Other long term (current) drug therapy: Secondary | ICD-10-CM | POA: Diagnosis not present

## 2018-06-04 DIAGNOSIS — M545 Low back pain: Secondary | ICD-10-CM | POA: Diagnosis not present

## 2018-06-04 DIAGNOSIS — R109 Unspecified abdominal pain: Secondary | ICD-10-CM | POA: Diagnosis not present

## 2018-06-04 MED ORDER — METHOCARBAMOL 500 MG PO TABS
1000.0000 mg | ORAL_TABLET | Freq: Once | ORAL | Status: AC
  Administered 2018-06-04: 1000 mg via ORAL
  Filled 2018-06-04: qty 2

## 2018-06-04 MED ORDER — METHOCARBAMOL 500 MG PO TABS: 1000.0000 mg | ORAL_TABLET | Freq: Four times a day (QID) | ORAL | 0 refills | Status: DC

## 2018-06-04 NOTE — Discharge Instructions (Signed)
Please read and follow all provided instructions.  Your diagnoses today include:  1. Acute left-sided low back pain without sciatica   2. Asymptomatic microscopic hematuria     Tests performed today include:  Vital signs - see below for your results today  Blood counts and electrolytes  Urine test -shows signs of blood but not sign of infection  CAT scan -does not show any kidney stones or other problems  Medications prescribed:   Robaxin (methocarbamol) - muscle relaxer medication  DO NOT drive or perform any activities that require you to be awake and alert because this medicine can make you drowsy.   Take any prescribed medications only as directed.  Home care instructions:   Follow any educational materials contained in this packet  Please rest, use ice or heat on your back for the next several days  Do not lift, push, pull anything more than 10 pounds for the next week  Follow-up instructions: Please follow-up with your primary care provider in the next 3 days for further evaluation of your symptoms if they are not getting better.   Return instructions:  SEEK IMMEDIATE MEDICAL ATTENTION IF YOU HAVE:  New numbness, tingling, weakness, or problem with the use of your arms or legs  Severe back pain not relieved with medications  Loss control of your bowels or bladder  Increasing pain in any areas of the body (such as chest or abdominal pain)  Shortness of breath, dizziness, or fainting.   Worsening nausea (feeling sick to your stomach), vomiting, fever, or sweats  Any other emergent concerns regarding your health   Additional Information:  Your vital signs today were: BP (!) 159/97 (BP Location: Left Arm)    Pulse (!) 104    Temp 99.2 F (37.3 C) (Oral)    Resp 18    Ht 5\' 5"  (1.651 m)    Wt 99.8 kg    SpO2 97%    BMI 36.61 kg/m  If your blood pressure (BP) was elevated above 135/85 this visit, please have this repeated by your doctor within one  month. --------------

## 2018-06-15 ENCOUNTER — Other Ambulatory Visit: Payer: Self-pay

## 2018-06-15 ENCOUNTER — Ambulatory Visit
Admission: RE | Admit: 2018-06-15 | Discharge: 2018-06-15 | Disposition: A | Discharge status: Home / Self Care | Payer: 59 | Source: Ambulatory Visit | Attending: Family Medicine | Admitting: Family Medicine

## 2018-06-15 DIAGNOSIS — Z1231 Encounter for screening mammogram for malignant neoplasm of breast: Secondary | ICD-10-CM

## 2018-07-13 ENCOUNTER — Encounter: Payer: Self-pay | Admitting: Family Medicine

## 2018-07-13 ENCOUNTER — Ambulatory Visit (INDEPENDENT_AMBULATORY_CARE_PROVIDER_SITE_OTHER): Payer: 59 | Admitting: Family Medicine

## 2018-07-13 ENCOUNTER — Other Ambulatory Visit (HOSPITAL_COMMUNITY)
Admission: RE | Admit: 2018-07-13 | Discharge: 2018-07-13 | Disposition: A | Discharge status: Home / Self Care | Payer: 59 | Source: Ambulatory Visit | Attending: Family Medicine | Admitting: Family Medicine

## 2018-07-13 ENCOUNTER — Other Ambulatory Visit: Payer: Self-pay

## 2018-07-13 VITALS — BP 124/82 | HR 90 | Wt 228.4 lb

## 2018-07-13 DIAGNOSIS — Z124 Encounter for screening for malignant neoplasm of cervix: Secondary | ICD-10-CM | POA: Diagnosis not present

## 2018-07-13 DIAGNOSIS — Z0000 Encounter for general adult medical examination without abnormal findings: Secondary | ICD-10-CM | POA: Diagnosis not present

## 2018-07-13 DIAGNOSIS — N939 Abnormal uterine and vaginal bleeding, unspecified: Secondary | ICD-10-CM | POA: Diagnosis not present

## 2018-07-13 NOTE — Assessment & Plan Note (Signed)
Reviewed with patient the options that are available to her, and advised that she consider trying a combined contraceptive, which would either be OCPs, NuvaRing, or the patch.  We could try Nexplanon or an IUD, but unfortunately these can also come with menstrual irregularities.  She is not interested in the IUD and will consider the other options.  She plans to think about it and call me to let me know what she prefers to do.

## 2018-07-13 NOTE — Assessment & Plan Note (Signed)
Pap smear performed today.  If cytology and HPV cotesting are normal, she will be due for her next one in 5 years.

## 2018-07-13 NOTE — Progress Notes (Signed)
   Subjective:    Kristen White - 48 y.o. female MRN [id number]  Date of birth: July 27, 1970  CC:  NHYLA NAPPI is here for her Pap smear and to discuss spotting while on Depo-Provera.   HPI: Spotting while on Depo-Provera Has a history of taking Depo-Provera for 12 years without any spotting, but she restarted this medication about 9 months ago after taking a few years off of it and has had spotting ever since.  It has not improved over the span of these 9 months.  She denies pelvic pain or concern for STIs.  She is monogamous with one female partner.  Recent left shoulder pain and back pain, both resolved Patient had left shoulder pain, likely either a rotator cuff tendinopathy or impingement, but this has since improved.  She had to go to the emergency department in May 2020 after developing acute onset, severe back pain that was nontraumatic.  A CT scan was performed and blood work is performed, and no abnormalities were noted.  She was given a muscle relaxer and NSAIDs, which improved her pain.  This has completely resolved.  She plans to restart swimming as soon as she can after the pool has been close during the [place] pandemic.  Health Maintenance:  There are no preventive care reminders to display for this patient.  -  reports that she has never smoked. She has never used smokeless tobacco. - Review of Systems: Per HPI. - Past Medical History: Patient Active Problem List   Diagnosis Date Noted  . Vaginal spotting 07/13/2018  . Healthcare maintenance 12/23/2017  . Weight gain 04/09/2017  . Sensorineural hearing loss (SNHL) of right ear 04/07/2017  . Seasonal allergies 04/06/2017  . Acid reflux 04/06/2017   - Medications: reviewed and updated   Objective:   Physical Exam BP 124/82   Pulse 90   Wt 228 lb 6.4 oz (103.6 kg)   SpO2 99%   BMI 38.01 kg/m  Gen: NAD, alert, cooperative with exam, well-appearing CV: RRR, good S1/S2, no murmur, no edema Resp: CTABL, no  wheezes, non-labored Abd: SNTND, BS present, no guarding or organomegaly GU: Normal vulva, vagina, and cervix without lesions.  A small amount of dark blood is visualized in the vaginal vault. Skin: no rashes, normal turgor  Psych: good insight, alert and oriented        Assessment & Plan:   Vaginal spotting Reviewed with patient the options that are available to her, and advised that she consider trying a combined contraceptive, which would either be OCPs, NuvaRing, or the patch.  We could try Nexplanon or an IUD, but unfortunately these can also come with menstrual irregularities.  She is not interested in the IUD and will consider the other options.  She plans to think about it and call me to let me know what she prefers to do.  Healthcare maintenance Pap smear performed today.  If cytology and HPV cotesting are normal, she will be due for her next one in 5 years.    Maia Breslow, M.D. 07/13/2018, 1:56 PM PGY-2, [place]

## 2018-07-17 LAB — CYTOLOGY - PAP
Diagnosis: NEGATIVE
HPV: NOT DETECTED

## 2018-07-19 ENCOUNTER — Encounter (HOSPITAL_COMMUNITY): Payer: Self-pay | Admitting: Family Medicine

## 2018-08-06 MED FILL — medroxyPROGESTERone ACETATE: 150 | 84 days supply | Qty: 1 | Fill #0

## 2018-09-18 ENCOUNTER — Encounter: Payer: Self-pay | Admitting: Family Medicine

## 2018-10-22 ENCOUNTER — Telehealth: Payer: 59 | Admitting: Nurse Practitioner

## 2018-10-22 DIAGNOSIS — R42 Dizziness and giddiness: Secondary | ICD-10-CM | POA: Diagnosis not present

## 2018-10-22 MED ORDER — MECLIZINE HCL 25 MG PO TABS: 25.0000 mg | ORAL_TABLET | Freq: Three times a day (TID) | ORAL | 0 refills | Status: DC | PRN

## 2018-10-22 NOTE — Progress Notes (Signed)
E Visit for Motion Sickness  We are sorry that you are not feeling well. Here is how we plan to help!  Based on what you have shared with me it looks like you have symptoms of motion sickness.  I have prescribed a medication that will help prevent or alleviate your symptoms:  Meclizine 25mg by mouth three times per day as needed for nausea/motion sickness   Prevention:  You might feel better if you keep your eyes focused on outside while you are in motion. For example, if you are in a car, sit in the front and look in the direction you are moving; if you are on a boat, stay on the deck and look to the horizon. This helps make what you see match the movement you are feeling, and so you are less likely to feel sick.  You should also avoid reading, watching a movie, texting or reading messages, or looking at things close to you inside the vehicle you are riding in.  Use the seat head rest. Lean your head against the back of the seat or head rest when traveling in vehicles with seats to minimize head movements.  On a ship: When making your reservations, choose a cabin in the middle of the ship and near the waterline. When on board, go up on deck and focus on the horizon.  In an airplane: Request a window seat and look out the window. A seat over the front edge of the wing is the most preferable spot (the degree of motion is the lowest here). Direct the air vent to blow cool air on your face.  On a train: Always face forward and sit near a window.  In a vehicle: Sit in the front seat; if you are the passenger, look at the scenery in the distance. For some people, driving the vehicle (rather than being a passenger) is an instant remedy.  Avoid others who have become nauseous with motion sickness. Seeing and smelling others who have motion sickness may cause you to become sick.  GET HELP RIGHT AWAY IF:  Your symptoms do not improve or worsen within 2 days after treatment.  You cannot keep  down fluids after trying the medication.  Other associated symptoms such as severe headache, visual field changes, fever, or intractable nausea and vomiting.  MAKE SURE YOU:  Understand these instructions. Will watch your condition. Will get help right away if you are not doing well or get worse.  Thank you for choosing an e-visit.  Your e-visit answers were reviewed by a board certified advanced clinical practitioner to complete your personal care plan. Depending upon the condition, your plan could have included both over the counter or prescription medications.  Please review your pharmacy choice. Be sure that the pharmacy you have chosen is open so that you can pick up your prescription now.  If there is a problem you may message your provider in MyChart to have the prescription routed to another pharmacy.  Your safety is important to us. If you have drug allergies check your prescription carefully.   For the next 24 hours, you can use MyChart to ask questions about today's visit, request a non-urgent call back, or ask for a work or school excuse from your e-visit provider.  You will get an e-mail in the next two days asking about your experience. I hope that your e-visit has been valuable and will speed your recovery.   References or for more information: https://wwwnc.cdc.gov/travel/yellowbook/2020/travel-by-air-land-sea/motion-sickness https://my.[facility].org/health/articles/12782-motion-sickness https://www.uptodate.com     5-10 minutes spent reviewing and documenting in chart.  

## 2018-11-05 ENCOUNTER — Other Ambulatory Visit: Payer: Self-pay | Admitting: Family Medicine

## 2018-11-06 ENCOUNTER — Other Ambulatory Visit: Payer: Self-pay | Admitting: Family Medicine

## 2018-11-06 MED FILL — medroxyPROGESTERone ACETATE: 150 | 84 days supply | Qty: 1 | Fill #0

## 2018-11-29 ENCOUNTER — Encounter: Payer: Self-pay | Admitting: Family Medicine

## 2018-11-29 ENCOUNTER — Other Ambulatory Visit: Payer: Self-pay | Admitting: Family Medicine

## 2018-11-29 DIAGNOSIS — Z20828 Contact with and (suspected) exposure to other viral communicable diseases: Secondary | ICD-10-CM

## 2018-11-29 DIAGNOSIS — Z20822 Contact with and (suspected) exposure to covid-19: Secondary | ICD-10-CM

## 2018-12-19 ENCOUNTER — Telehealth: Payer: 59 | Admitting: Family

## 2018-12-19 DIAGNOSIS — J069 Acute upper respiratory infection, unspecified: Secondary | ICD-10-CM

## 2018-12-19 MED ORDER — FLUTICASONE PROPIONATE 50 MCG/ACT NA SUSP: 2.0000 | Freq: Every day | NASAL | 6 refills | Status: DC

## 2018-12-19 NOTE — Progress Notes (Signed)
We are sorry you are not feeling well.  Here is how we plan to help!  Based on what you have shared with me, it looks like you may have a viral upper respiratory infection.  Upper respiratory infections are caused by a large number of viruses; however, rhinovirus is the most common cause.   Symptoms vary from person to person, with common symptoms including sore throat, cough, fatigue or lack of energy and feeling of general discomfort.  A low-grade fever of up to 100.4 may present, but is often uncommon.  Symptoms vary however, and are closely related to a person's age or underlying illnesses.  The most common symptoms associated with an upper respiratory infection are nasal discharge or congestion, cough, sneezing, headache and pressure in the ears and face.  These symptoms usually persist for about 3 to 10 days, but can last up to 2 weeks.  It is important to know that upper respiratory infections do not cause serious illness or complications in most cases.    Upper respiratory infections can be transmitted from person to person, with the most common method of transmission being a person's hands.  The virus is able to live on the skin and can infect other persons for up to 2 hours after direct contact.  Also, these can be transmitted when someone coughs or sneezes; thus, it is important to cover the mouth to reduce this risk.  To keep the spread of the illness at bay, good hand hygiene is very important.  This is an infection that is most likely caused by a virus. There are no specific treatments other than to help you with the symptoms until the infection runs its course.  We are sorry you are not feeling well.  Here is how we plan to help!   For nasal congestion, you may use an oral decongestants such as Mucinex D or if you have glaucoma or high blood pressure use plain Mucinex.  Saline nasal spray or nasal drops can help and can safely be used as often as needed for congestion.  For your congestion,  I have prescribed Fluticasone nasal spray one spray in each nostril twice a day  If you do not have a history of heart disease, hypertension, diabetes or thyroid disease, prostate/bladder issues or glaucoma, you may also use Sudafed to treat nasal congestion.  It is highly recommended that you consult with a pharmacist or your primary care physician to ensure this medication is safe for you to take.       If you have a sore or scratchy throat, use a saltwater gargle-  to  teaspoon of salt dissolved in a 4-ounce to 8-ounce glass of warm water.  Gargle the solution for approximately 15-30 seconds and then spit.  It is important not to swallow the solution.  You can also use throat lozenges/cough drops and Chloraseptic spray to help with throat pain or discomfort.  Warm or cold liquids can also be helpful in relieving throat pain.  For headache, pain or general discomfort, you can use Ibuprofen or Tylenol as directed.   Some authorities believe that zinc sprays or the use of Echinacea may shorten the course of your symptoms.   HOME CARE . Only take medications as instructed by your medical team. . Be sure to drink plenty of fluids. Water is fine as well as fruit juices, sodas and electrolyte beverages. You may want to stay away from caffeine or alcohol. If you are nauseated, try taking small   sips of liquids. How do you know if you are getting enough fluid? Your urine should be a pale yellow or almost colorless. . Get rest. . Taking a steamy shower or using a humidifier may help nasal congestion and ease sore throat pain. You can place a towel over your head and breathe in the steam from hot water coming from a faucet. . Using a saline nasal spray works much the same way. . Cough drops, hard candies and sore throat lozenges may ease your cough. . Avoid close contacts especially the very young and the elderly . Cover your mouth if you cough or sneeze . Always remember to wash your hands.   GET  HELP RIGHT AWAY IF: . You develop worsening fever. . If your symptoms do not improve within 10 days . You develop yellow or green discharge from your nose over 3 days. . You have coughing fits . You develop a severe head ache or visual changes. . You develop shortness of breath, difficulty breathing or start having chest pain . Your symptoms persist after you have completed your treatment plan  MAKE SURE YOU   Understand these instructions.  Will watch your condition.  Will get help right away if you are not doing well or get worse.  Your e-visit answers were reviewed by a board certified advanced clinical practitioner to complete your personal care plan. Depending upon the condition, your plan could have included both over the counter or prescription medications. Please review your pharmacy choice. If there is a problem, you may call our nursing hot line at and have the prescription routed to another pharmacy. Your safety is important to us. If you have drug allergies check your prescription carefully.   You can use MyChart to ask questions about today's visit, request a non-urgent call back, or ask for a work or school excuse for 24 hours related to this e-Visit. If it has been greater than 24 hours you will need to follow up with your provider, or enter a new e-Visit to address those concerns. You will get an e-mail in the next two days asking about your experience.  I hope that your e-visit has been valuable and will speed your recovery. Thank you for using e-visits.   Approximately 5 minutes was spent documenting and reviewing patient's chart.     

## 2018-12-20 ENCOUNTER — Other Ambulatory Visit: Payer: Self-pay

## 2018-12-20 ENCOUNTER — Encounter: Payer: Self-pay | Admitting: Family Medicine

## 2018-12-20 ENCOUNTER — Ambulatory Visit
Admission: EM | Admit: 2018-12-20 | Discharge: 2018-12-20 | Disposition: A | Discharge status: Home / Self Care | Payer: 59 | Attending: Physician Assistant | Admitting: Physician Assistant

## 2018-12-20 DIAGNOSIS — J029 Acute pharyngitis, unspecified: Secondary | ICD-10-CM | POA: Diagnosis not present

## 2018-12-20 DIAGNOSIS — J020 Streptococcal pharyngitis: Secondary | ICD-10-CM | POA: Diagnosis not present

## 2018-12-20 DIAGNOSIS — Z20828 Contact with and (suspected) exposure to other viral communicable diseases: Secondary | ICD-10-CM | POA: Diagnosis not present

## 2018-12-20 LAB — POCT RAPID STREP A (OFFICE): Rapid Strep A Screen: POSITIVE — AB

## 2018-12-20 MED ORDER — PHENOL 1.4 % MT LIQD: 1.0000 | OROMUCOSAL | 0 refills | Status: DC | PRN

## 2018-12-20 MED ORDER — AMOXICILLIN 500 MG PO CAPS: 500.0000 mg | ORAL_CAPSULE | Freq: Two times a day (BID) | ORAL | 0 refills | Status: DC

## 2018-12-20 NOTE — ED Provider Notes (Signed)
[facility]    CSN: [id number] Arrival date & time: 12/20/18  6734      History   Chief Complaint Chief Complaint  Patient presents with  . Otalgia    HPI Kristen White is a 48 y.o. female.   48 year old female comes in for 4 day history of sore throat, left ear pain. Denies fever, chills, body aches. Denies abdominal pain, nausea, vomiting, diarrhea. Denies shortness of breath, loss of taste/smell. States left ear pain is constant, worse with swallowing. Denies ear drainage, changes in hearing. She has been using nasonex and gurgling salt water and states sore throat has improved. Has not been tested for COVID.      Past Medical History:  Diagnosis Date  . Allergy     Patient Active Problem List   Diagnosis Date Noted  . Vaginal spotting 07/13/2018  . Healthcare maintenance 12/23/2017  . Weight gain 04/09/2017  . Sensorineural hearing loss (SNHL) of right ear 04/07/2017  . Seasonal allergies 04/06/2017  . Acid reflux 04/06/2017    Past Surgical History:  Procedure Laterality Date  . TENOLYSIS FOREARM / WRIST      OB History   No obstetric history on file.      Home Medications    Prior to Admission medications   Medication Sig Start Date End Date Taking? Authorizing Provider  amoxicillin (AMOXIL) 500 MG capsule Take 1 capsule (500 mg total) by mouth 2 (two) times daily. 12/20/18   Tasia Catchings, Radley Barto V, PA-C  cetirizine (ZYRTEC) 10 MG tablet Take 10 mg by mouth daily.    [provider]  fluticasone (FLONASE) 50 MCG/ACT nasal spray Place 2 sprays into both nostrils daily. 12/19/18   Evelina Dun A, FNP  ibuprofen (ADVIL) 200 MG tablet Take 200 mg by mouth every 6 (six) hours as needed for headache or moderate pain.    [provider]  meclizine (ANTIVERT) 25 MG tablet Take 1 tablet (25 mg total) by mouth 3 (three) times daily as needed for dizziness. 10/22/18   Hassell Done, Mary-Margaret, FNP  medroxyPROGESTERone Acetate 150 MG/ML SUSY  INJECT 1 ML INTO THE MUSCLE EVERY 3 MONTHS. 11/06/18   Kathrene Alu, MD  methocarbamol (ROBAXIN) 500 MG tablet Take 2 tablets (1,000 mg total) by mouth 4 (four) times daily. 06/04/18   Carlisle Cater, PA-C  phenol (CHLORASEPTIC) 1.4 % LIQD Use as directed 1 spray in the mouth or throat as needed for throat irritation / pain. 12/20/18   Renita Papa, PA-C    Family History Family History  Problem Relation Age of Onset  . Early death Mother   . Hypertension Father   . Alcohol abuse Maternal Uncle   . Alcohol abuse Paternal Uncle     Social History Social History   Tobacco Use  . Smoking status: Never Smoker  . Smokeless tobacco: Never Used  Substance Use Topics  . Alcohol use: Not on file    Comment: rare  . Drug use: Never     Allergies   No known allergies and Metronidazole   Review of Systems Review of Systems  Reason unable to perform ROS: See HPI as above.     Physical Exam Triage Vital Signs ED Triage Vitals  Enc Vitals Group     BP 12/20/18 0900 (!) 142/83     Pulse Rate 12/20/18 0900 100     Resp 12/20/18 0900 18     Temp 12/20/18 0900 98.9 F (37.2 C)  Temp Source 12/20/18 0900 Oral     SpO2 12/20/18 0900 98 %     Weight --      Height --      Head Circumference --      Peak Flow --      Pain Score 12/20/18 0902 0     Pain Loc --      Pain Edu? --      Excl. in GC? --    No data found.  Updated Vital Signs BP (!) 142/83 (BP Location: Left Arm)   Pulse 100   Temp 98.9 F (37.2 C) (Oral)   Resp 18   SpO2 98%   Physical Exam Constitutional:      General: She is not in acute distress.    Appearance: Normal appearance. She is not ill-appearing, toxic-appearing or diaphoretic.  HENT:     Head: Normocephalic and atraumatic.     Right Ear: Tympanic membrane, ear canal and external ear normal. Tympanic membrane is not erythematous or bulging.     Left Ear: Tympanic membrane, ear canal and external ear normal. Tympanic membrane is not  erythematous or bulging.     Mouth/Throat:     Mouth: Mucous membranes are moist.     Pharynx: Oropharynx is clear. Uvula midline.     Tonsils: Tonsillar exudate present. 2+ on the right. 2+ on the left.  Neck:     Musculoskeletal: Normal range of motion and neck supple.  Cardiovascular:     Rate and Rhythm: Normal rate and regular rhythm.     Heart sounds: Normal heart sounds. No murmur. No friction rub. No gallop.   Pulmonary:     Effort: Pulmonary effort is normal. No accessory muscle usage, prolonged expiration, respiratory distress or retractions.     Comments: Lungs clear to auscultation without adventitious lung sounds. Neurological:     General: No focal deficit present.     Mental Status: She is alert and oriented to person, place, and time.      UC Treatments / Results  Labs (all labs ordered are listed, but only abnormal results are displayed) Labs Reviewed  POCT RAPID STREP A (OFFICE) - Abnormal; Notable for the following components:      Result Value   Rapid Strep A Screen Positive (*)    All other components within normal limits  NOVEL CORONAVIRUS, NAA    EKG   Radiology No results found.  Procedures Procedures (including critical care time)  Medications Ordered in UC Medications - No data to display  Initial Impression / Assessment and Plan / UC Course  I have reviewed the triage vital signs and the nursing notes.  Pertinent labs & imaging results that were available during my care of the patient were reviewed by me and considered in my medical decision making (see chart for details).    Rapid strep positive. Will start amoxicillin as directed. Discussed cannot rule out COVID, testing sent. Patient to quarantine until testing results return. No alarming signs on exam.  Patient speaking in full sentences without respiratory distress.  Symptomatic treatment discussed.  Push fluids.  Return precautions given.  Patient expresses understanding and agrees to  plan.  Final Clinical Impressions(s) / UC Diagnoses   Final diagnoses:  Sore throat  Streptococcal sore throat   ED Prescriptions    Medication Sig Dispense Auth. Provider   amoxicillin (AMOXIL) 500 MG capsule Take 1 capsule (500 mg total) by mouth 2 (two) times daily. 20 capsule Belinda Fisher,  PA-C     PDMP not reviewed this encounter.   Belinda FisherYu, Joeanne Robicheaux V, PA-C 12/20/18 1021

## 2018-12-20 NOTE — Addendum Note (Signed)
Addended by: Rodell Perna A on: 12/20/2018 08:14 AM   Modules accepted: Orders

## 2018-12-20 NOTE — ED Triage Notes (Signed)
Pt states had an e-visit yesterday and treated for sore throat and lt ear pain. Pt states today her lt ear is worse and throat is better

## 2018-12-20 NOTE — Discharge Instructions (Addendum)
Rapid strep positive. Start amoxicillin as directed. This does not rule out COVID, testing sent. I would like you to quarantine until testing results. Start nasonex, this will help with left ear pain as no infection was noted on exam.  If experiencing shortness of breath, trouble breathing, go to the emergency department for further evaluation needed.  Monitor for any worsening of symptoms, trouble breathing, trouble swallowing, swelling of the throat, leaning forward to breath, drooling, follow up here or at the emergency department for reevaluation.

## 2018-12-23 LAB — NOVEL CORONAVIRUS, NAA: SARS-CoV-2, NAA: NOT DETECTED

## 2019-01-15 ENCOUNTER — Telehealth: Payer: 59 | Admitting: Physician Assistant

## 2019-01-15 DIAGNOSIS — M545 Low back pain, unspecified: Secondary | ICD-10-CM

## 2019-01-15 MED ORDER — METHOCARBAMOL 500 MG PO TABS: 500.0000 mg | ORAL_TABLET | Freq: Three times a day (TID) | ORAL | 0 refills | Status: DC | PRN

## 2019-01-15 NOTE — Progress Notes (Signed)
We are sorry that you are not feeling well.  Here is how we plan to help!  Based on what you have shared with me it looks like you mostly have acute back pain.  Acute back pain is defined as musculoskeletal pain that can resolve in 1-3 weeks with conservative treatment.  I have prescribed methocarbamol 1 tablet every 8 hours as needed for muscle spasms. I usually recommend alternating 600 mg of ibuprofen and [phone] mg of Tylenol every 3-6 hours as needed for pain. Do not exceed 4000 mg of Tylenol daily. Take ibuprofen with food to avoid upset stomach issues. You can also apply over the counter patches such as salonpas, lidocaine patches, or icy hot to areas of pain.    Some patients experience stomach irritation or in increased heartburn with anti-inflammatory drugs.  Please keep in mind that muscle relaxer's can cause fatigue and should not be taken while at work or driving.  Back pain is very common.  The pain often gets better over time.  The cause of back pain is usually not dangerous.  Most people can learn to manage their back pain on their own.  Home Care  Stay active.  Start with short walks on flat ground if you can.  Try to walk farther each day.  Do not sit, drive or stand in one place for more than 30 minutes.  Do not stay in bed.  Do not avoid exercise or work.  Activity can help your back heal faster.  Be careful when you bend or lift an object.  Bend at your knees, keep the object close to you, and do not twist.  Sleep on a firm mattress.  Lie on your side, and bend your knees.  If you lie on your back, put a pillow under your knees.  Only take medicines as told by your doctor.  Put ice on the injured area.  Put ice in a plastic bag  Place a towel between your skin and the bag  Leave the ice on for 15-20 minutes, 3-4 times a day for the first 2-3 days. 210 After that, you can switch between ice and heat packs.  Ask your doctor about back exercises or massage.  Avoid  feeling anxious or stressed.  Find good ways to deal with stress, such as exercise.  Get Help Right Way If:  Your pain does not go away with rest or medicine.  Your pain does not go away in 1 week.  You have new problems.  You do not feel well.  The pain spreads into your legs.  You cannot control when you poop (bowel movement) or pee (urinate)  You feel sick to your stomach (nauseous) or throw up (vomit)  You have belly (abdominal) pain.  You feel like you may pass out (faint).  If you develop a fever.  Make Sure you:  Understand these instructions.  Will watch your condition  Will get help right away if you are not doing well or get worse.  Your e-visit answers were reviewed by a board certified advanced clinical practitioner to complete your personal care plan.  Depending on the condition, your plan could have included both over the counter or prescription medications.  If there is a problem please reply  once you have received a response from your provider.  Your safety is important to [place].  If you have drug allergies check your prescription carefully.    You can use MyChart to ask questions about today's  visit, request a non-urgent call back, or ask for a work or school excuse for 24 hours related to this e-Visit. If it has been greater than 24 hours you will need to follow up with your provider, or enter a new e-Visit to address those concerns.  You will get an e-mail in the next two days asking about your experience.  I hope that your e-visit has been valuable and will speed your recovery. Thank you for using e-visits.  Greater than 5 minutes, yet less than 10 minutes of time have been spent researching, coordinating, and implementing care for this patient today.

## 2019-02-05 ENCOUNTER — Other Ambulatory Visit: Payer: Self-pay | Admitting: Family Medicine

## 2019-02-05 MED ORDER — MEDROXYPROGESTERONE ACETATE 150 MG/ML IM SUSY: 150.0000 mg | PREFILLED_SYRINGE | Freq: Once | INTRAMUSCULAR | 0 refills | Status: DC

## 2019-03-15 ENCOUNTER — Ambulatory Visit: Payer: 59 | Attending: Internal Medicine

## 2019-03-15 ENCOUNTER — Other Ambulatory Visit: Payer: Self-pay

## 2019-03-15 DIAGNOSIS — Z23 Encounter for immunization: Secondary | ICD-10-CM | POA: Insufficient documentation

## 2019-03-15 NOTE — Progress Notes (Signed)
   Covid-19 Vaccination Clinic  Name:  Kristen White    MRN: [id number] DOB: 07/02/1970  03/15/2019  [name] was observed post Covid-19 immunization for 15 minutes without incidence. She was provided with Vaccine Information Sheet and instruction to access the V-Safe system.   [name] was instructed to call 911 with any severe reactions post vaccine: Marland Kitchen Difficulty breathing  . Swelling of your face and throat  . A fast heartbeat  . A bad rash all over your body  . Dizziness and weakness    Immunizations Administered    Name Date Dose VIS Date Route   Pfizer COVID-19 Vaccine 03/15/2019 11:16 AM 0.3 mL 12/28/2018 Intramuscular   Manufacturer: [employer], [employer]   Lot: [id number]   NDC: [address]-9

## 2019-03-30 ENCOUNTER — Ambulatory Visit (INDEPENDENT_AMBULATORY_CARE_PROVIDER_SITE_OTHER)
Admission: RE | Admit: 2019-03-30 | Discharge: 2019-03-30 | Disposition: A | Discharge status: Home / Self Care | Payer: 59 | Source: Ambulatory Visit

## 2019-03-30 DIAGNOSIS — Z760 Encounter for issue of repeat prescription: Secondary | ICD-10-CM

## 2019-03-30 DIAGNOSIS — M62838 Other muscle spasm: Secondary | ICD-10-CM

## 2019-03-30 MED ORDER — METHOCARBAMOL 500 MG PO TABS: 500.0000 mg | ORAL_TABLET | Freq: Three times a day (TID) | ORAL | 0 refills | Status: DC | PRN

## 2019-03-30 NOTE — Discharge Instructions (Addendum)
30 days course of Robaxin was refilled Advised patient to follow-up with PCP for further refill if needed Return for worsening of symptoms

## 2019-03-30 NOTE — ED Provider Notes (Signed)
Virtual Visit via Video Note:  Kristen White  initiated request for Telemedicine visit with [facility] team. I connected with Kristen White  on 03/30/2019 at 1126 AM  for a synchronized telemedicine visit using a video enabled HIPPA compliant telemedicine application. I verified that I am speaking with Kristen White  using two identifiers. Kristen Parcel, FNP  was physically located in a [facility] Urgent care site and Kristen White was located at a different location.   The limitations of evaluation and management by telemedicine as well as the availability of in-person appointments were discussed. Patient was informed that she  may incur a bill ( including co-pay) for this virtual visit encounter. Kristen White  expressed understanding and gave verbal consent to proceed with virtual visit.     History of Present Illness:Kristen White  is a 49 y.o. female presents via telehealth with a complaint of medication refill.  She would like Robaxin to be refilled.  She states she has been prescribed this medication for muscle spasm.  Currently ran out of this medication.  PCP was unable to refill as they are close.  Denies chills, fever, vomiting, confusion, chest pain, chest tightness.  Past Medical History:  Diagnosis Date  . Allergy     Allergies  Allergen Reactions  . No Known Allergies   . Metronidazole Other (See Comments)    "vertigo"        Observations/Objective:VITALS: Per patient if applicable, see vitals. GENERAL: Alert, appears well and in no acute distress. HEENT: Atraumatic, conjunctiva clear, no obvious abnormalities on inspection of external nose and ears. NECK: Normal movements of the head and neck. CARDIOPULMONARY: No increased WOB. Speaking in clear sentences. I:E ratio WNL.  PSYCH: Pleasant and cooperative, well-groomed. Speech normal rate and rhythm. Affect is appropriate. Insight and judgement are appropriate. Attention is focused,  linear, and appropriate.  SKIN: No obvious lesions, wounds, erythema, or cyanosis noted on face or hands.    Assessment and Plan:  1.  Medication refill 30 days course of Robaxin was refilled.  Follow Up Instructions: Follow-up with primary care Return for worsening of symptoms   I discussed the assessment and treatment plan with the patient. The patient was provided an opportunity to ask questions and all were answered. The patient agreed with the plan and demonstrated an understanding of the instructions.   The patient was advised to call back or seek an in-person evaluation if the symptoms worsen or if the condition fails to improve as anticipated.  I provided 10 minutes of non-face-to-face time during this encounter.    Kristen Parcel, FNP  03/30/2019 11:42 AM         Kristen Parcel, FNP 03/30/19 1142

## 2019-04-10 ENCOUNTER — Ambulatory Visit: Payer: 59

## 2019-04-23 DIAGNOSIS — Z20828 Contact with and (suspected) exposure to other viral communicable diseases: Secondary | ICD-10-CM | POA: Diagnosis not present

## 2019-04-29 ENCOUNTER — Other Ambulatory Visit: Payer: Self-pay | Admitting: Family Medicine

## 2019-05-01 ENCOUNTER — Encounter: Payer: Self-pay | Admitting: Family Medicine

## 2019-05-01 ENCOUNTER — Other Ambulatory Visit: Payer: Self-pay

## 2019-05-01 ENCOUNTER — Ambulatory Visit (INDEPENDENT_AMBULATORY_CARE_PROVIDER_SITE_OTHER): Payer: 59 | Admitting: Family Medicine

## 2019-05-01 VITALS — BP 118/80 | HR 103 | Ht 65.0 in | Wt 229.0 lb

## 2019-05-01 DIAGNOSIS — R252 Cramp and spasm: Secondary | ICD-10-CM | POA: Diagnosis not present

## 2019-05-01 DIAGNOSIS — Z0000 Encounter for general adult medical examination without abnormal findings: Secondary | ICD-10-CM | POA: Diagnosis not present

## 2019-05-01 DIAGNOSIS — Z1231 Encounter for screening mammogram for malignant neoplasm of breast: Secondary | ICD-10-CM

## 2019-05-01 NOTE — Patient Instructions (Addendum)
It was nice seeing you today Kristen White!  I have ordered your mammogram for this year.  You can make this appointment whenever is convenient for you.  I am including back surgeries and exercises that you can incorporate into your routine if you find them helpful.  Congratulations on graduating school excavation mark  If you have any questions or concerns, please feel free to call the clinic.   Be well,  Dr. Frances Furbish  Back Exercises The following exercises strengthen the muscles that help to support the trunk and back. They also help to keep the lower back flexible. Doing these exercises can help to prevent back pain or lessen existing pain.  If you have back pain or discomfort, try doing these exercises 2-3 times each day or as told by your health care provider.  As your pain improves, do them once each day, but increase the number of times that you repeat the steps for each exercise (do more repetitions).  To prevent the recurrence of back pain, continue to do these exercises once each day or as told by your health care provider. Do exercises exactly as told by your health care provider and adjust them as directed. It is normal to feel mild stretching, pulling, tightness, or discomfort as you do these exercises, but you should stop right away if you feel sudden pain or your pain gets worse. Exercises Single knee to chest Repeat these steps 3-5 times for each leg: 1. Lie on your back on a firm bed or the floor with your legs extended. 2. Bring one knee to your chest. Your other leg should stay extended and in contact with the floor. 3. Hold your knee in place by grabbing your knee or thigh with both hands and hold. 4. Pull on your knee until you feel a gentle stretch in your lower back or buttocks. 5. Hold the stretch for 10-30 seconds. 6. Slowly release and straighten your leg. Pelvic tilt Repeat these steps 5-10 times: 1. Lie on your back on a firm bed or the floor with your legs  extended. 2. Bend your knees so they are pointing toward the ceiling and your feet are flat on the floor. 3. Tighten your lower abdominal muscles to press your lower back against the floor. This motion will tilt your pelvis so your tailbone points up toward the ceiling instead of pointing to your feet or the floor. 4. With gentle tension and even breathing, hold this position for 5-10 seconds. Cat-cow Repeat these steps until your lower back becomes more flexible: 1. Get into a hands-and-knees position on a firm surface. Keep your hands under your shoulders, and keep your knees under your hips. You may place padding under your knees for comfort. 2. Let your head hang down toward your chest. Contract your abdominal muscles and point your tailbone toward the floor so your lower back becomes rounded like the back of a cat. 3. Hold this position for 5 seconds. 4. Slowly lift your head, let your abdominal muscles relax and point your tailbone up toward the ceiling so your back forms a sagging arch like the back of a cow. 5. Hold this position for 5 seconds.  Press-ups Repeat these steps 5-10 times: 1. Lie on your abdomen (face-down) on the floor. 2. Place your palms near your head, about shoulder-width apart. 3. Keeping your back as relaxed as possible and keeping your hips on the floor, slowly straighten your arms to raise the top half of your body  and lift your shoulders. Do not use your back muscles to raise your upper torso. You may adjust the placement of your hands to make yourself more comfortable. 4. Hold this position for 5 seconds while you keep your back relaxed. 5. Slowly return to lying flat on the floor.  Bridges Repeat these steps 10 times: 1. Lie on your back on a firm surface. 2. Bend your knees so they are pointing toward the ceiling and your feet are flat on the floor. Your arms should be flat at your sides, next to your body. 3. Tighten your buttocks muscles and lift your  buttocks off the floor until your waist is at almost the same height as your knees. You should feel the muscles working in your buttocks and the back of your thighs. If you do not feel these muscles, slide your feet 1-2 inches farther away from your buttocks. 4. Hold this position for 3-5 seconds. 5. Slowly lower your hips to the starting position, and allow your buttocks muscles to relax completely. If this exercise is too easy, try doing it with your arms crossed over your chest. Abdominal crunches Repeat these steps 5-10 times: 1. Lie on your back on a firm bed or the floor with your legs extended. 2. Bend your knees so they are pointing toward the ceiling and your feet are flat on the floor. 3. Cross your arms over your chest. 4. Tip your chin slightly toward your chest without bending your neck. 5. Tighten your abdominal muscles and slowly raise your trunk (torso) high enough to lift your shoulder blades a tiny bit off the floor. Avoid raising your torso higher than that because it can put too much stress on your low back and does not help to strengthen your abdominal muscles. 6. Slowly return to your starting position. Back lifts Repeat these steps 5-10 times: 1. Lie on your abdomen (face-down) with your arms at your sides, and rest your forehead on the floor. 2. Tighten the muscles in your legs and your buttocks. 3. Slowly lift your chest off the floor while you keep your hips pressed to the floor. Keep the back of your head in line with the curve in your back. Your eyes should be looking at the floor. 4. Hold this position for 3-5 seconds. 5. Slowly return to your starting position. Contact a health care provider if:  Your back pain or discomfort gets much worse when you do an exercise.  Your worsening back pain or discomfort does not lessen within 2 hours after you exercise. If you have any of these problems, stop doing these exercises right away. Do not do them again unless your  health care provider says that you can. Get help right away if:  You develop sudden, severe back pain. If this happens, stop doing the exercises right away. Do not do them again unless your health care provider says that you can. This information is not intended to replace advice given to you by your health care provider. Make sure you discuss any questions you have with your health care provider. Document Revised: 05/10/2018 Document Reviewed: 10/05/2017 Elsevier Patient Education  [place].

## 2019-05-01 NOTE — Progress Notes (Signed)
    SUBJECTIVE:   CHIEF COMPLAINT / HPI:   Annual physical Patient reports that overall, she is doing well.  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  She continues to swim and walk regularly for exercise.  Depo Provera is working well for contraception for her.  She is currently living with her boyfriend and notes that this relationship is going well and that she feels safe.  She is due for her mammogram in May of this year.  She reports that she has a history of very dense breasts and breast cysts, so she would like to get a mammogram yearly.  She is considering moving to a medical practice closer to her home, which would likely be [facility].  She does not smoke and drinks alcohol occasionally.  Back spasms Has had off and on spasms of her back, which have come and gone but have not gotten better overall.  She does say that Robaxin is helpful.  She is trying to incorporate back stretches and exercises but does not want to exacerbate her back pain further by doing the wrong exercises.  PERTINENT  PMH / PSH: Muscle spasms, sensorineural hearing loss of right ear, acid reflux  OBJECTIVE:   BP 118/80   Pulse (!) 103   Ht 5\' 5"  (1.651 m)   Wt 229 lb (103.9 kg)   SpO2 100%   BMI 38.11 kg/m   General: well appearing, appears stated age, pleasant Cardiac: RRR, no MRG Respiratory: CTAB, no rhonchi, rales, or wheezing, normal work of breathing Skin: no rashes or other lesions, warm and well perfused Psych: appropriate mood and affect   ASSESSMENT/PLAN:   Healthcare maintenance Placed order for mammogram.  Patient is up-to-date on labs and Pap smear.  Muscle cramping We will continue as needed Robaxin.  Also counseled patient on adding Tylenol and NSAIDs occasionally to see if this will also help her pain.  Congratulated patient on staying active and experimenting with stretching and exercises to help improve her back pain.  Gave her handout on  new exercises that she can incorporate if she sees fit.     , MD [facility]

## 2019-05-02 DIAGNOSIS — R252 Cramp and spasm: Secondary | ICD-10-CM | POA: Insufficient documentation

## 2019-05-02 MED ORDER — METHOCARBAMOL 500 MG PO TABS: 500.0000 mg | ORAL_TABLET | Freq: Three times a day (TID) | ORAL | 0 refills | Status: DC | PRN

## 2019-05-02 MED FILL — METHOCARBAMOL 500 MG TABS: 500 | 10 days supply | Qty: 30 | Fill #0

## 2019-05-02 NOTE — Assessment & Plan Note (Signed)
We will continue as needed Robaxin.  Also counseled patient on adding Tylenol and NSAIDs occasionally to see if this will also help her pain.  Congratulated patient on staying active and experimenting with stretching and exercises to help improve her back pain.  Gave her handout on new exercises that she can incorporate if she sees fit.

## 2019-05-02 NOTE — Assessment & Plan Note (Addendum)
Placed order for mammogram.  Patient is up-to-date on labs and Pap smear.

## 2019-05-09 DIAGNOSIS — Q163 Congenital malformation of ear ossicles: Secondary | ICD-10-CM | POA: Diagnosis not present

## 2019-05-09 DIAGNOSIS — G039 Meningitis, unspecified: Secondary | ICD-10-CM | POA: Diagnosis not present

## 2019-05-09 DIAGNOSIS — H9071 Mixed conductive and sensorineural hearing loss, unilateral, right ear, with unrestricted hearing on the contralateral side: Secondary | ICD-10-CM | POA: Diagnosis not present

## 2019-06-14 ENCOUNTER — Encounter: Payer: Self-pay | Admitting: Gastroenterology

## 2019-06-14 ENCOUNTER — Encounter: Payer: Self-pay | Admitting: Internal Medicine

## 2019-06-14 ENCOUNTER — Telehealth (INDEPENDENT_AMBULATORY_CARE_PROVIDER_SITE_OTHER): Payer: 59 | Admitting: Internal Medicine

## 2019-06-14 DIAGNOSIS — Z1211 Encounter for screening for malignant neoplasm of colon: Secondary | ICD-10-CM | POA: Diagnosis not present

## 2019-06-14 DIAGNOSIS — Z8371 Family history of colonic polyps: Secondary | ICD-10-CM | POA: Diagnosis not present

## 2019-06-14 DIAGNOSIS — Z7689 Persons encountering health services in other specified circumstances: Secondary | ICD-10-CM

## 2019-06-14 DIAGNOSIS — M62830 Muscle spasm of back: Secondary | ICD-10-CM | POA: Diagnosis not present

## 2019-06-14 NOTE — Progress Notes (Signed)
Virtual Visit via Telephone Note  I connected with Kristen White, on 06/14/2019 at 9:51 AM by telephone due to the COVID-19 pandemic and verified that I am speaking with the correct person using two identifiers.   Consent: I discussed the limitations, risks, security and privacy concerns of performing an evaluation and management service by telephone and the availability of in person appointments. I also discussed with the patient that there may be a patient responsible charge related to this service. The patient expressed understanding and agreed to proceed.   Location of Patient: Home   Location of Provider: Clinic    Persons participating in Telemedicine visit: Ricarda Atayde [facility] Dr. Earlene Plater      History of Present Illness: Patient has a visit to establish care. PMH of seasonal allergies, acid reflux, back spasms.   Reports back spasms started in May 2020 suddenly. She a had a bad bout of similar spasms in January. She is typically well controlled with Robaxin and NSAIDs. Previous PCP, Dr. Frances Furbish gave her home stretching/exercises but left it up to patient to determine if wants to pursue PT.   Patient has concerns about family history of colon polyps. Her dad has had polyps present on two colonoscopies--unsure of what type of polyps. Her paternal uncle also had polyps that ruptured and required massive blood transfusion. She does not have any changes in her bowels, abdominal pain, or hematochezia or melena. She would like to have colonoscopy for screening given family history.    Past Medical History:  Diagnosis Date  . Allergy    Allergies  Allergen Reactions  . No Known Allergies   . Metronidazole Other (See Comments)    "vertigo"    Current Outpatient Medications on File Prior to Visit  Medication Sig Dispense Refill  . cetirizine (ZYRTEC) 10 MG tablet Take 10 mg by mouth daily.    . fluticasone (FLONASE) 50 MCG/ACT nasal spray Place 2 sprays into  both nostrils daily. 16 g 6  . medroxyPROGESTERone Acetate 150 MG/ML SUSY INJECT 1 ML INTO THE MUSCLE ONCE FOR 1 DOSE. INJECT 1 ML INTO THE MUSCLE EVERY 3 MONTHS. 1 mL 3  . methocarbamol (ROBAXIN) 500 MG tablet Take 1 tablet (500 mg total) by mouth every 8 (eight) hours as needed for muscle spasms. 30 tablet 0  . Multiple Vitamin (MULTIVITAMIN) tablet Take 1 tablet by mouth daily.     No current facility-administered medications on file prior to visit.    Observations/Objective: NAD. Speaking clearly.  Work of breathing normal.  Alert and oriented. Mood appropriate.   Assessment and Plan: 1. Encounter to establish care Reviewed patient's PMH, social history, surgical history, and medications.  She recently had annual exam with previous PCP in April 2021.   2. Family history of colonic polyps 3. Colon cancer screening Given that patient is at theoretical higher risk of colon cancer due to race as well as family history of colon polyps, will refer to GI for colonoscopy. Additionally, the [employer] of [employer]astro[employer] recently recommended initiating screening in all adults of average risk at 47 years of age.  - Ambulatory referral to Gastroenterology  4. Back spasm Currently well managed with medications and at home exercises/stretches. She is to let me know if she decides she would like PT evaluation.    Follow Up Instructions: Annual exam April 2022 or sooner for acute concerns    I discussed the assessment and treatment plan with the patient. The patient was provided an opportunity  to ask questions and all were answered. The patient agreed with the plan and demonstrated an understanding of the instructions.   The patient was advised to call back or seek an in-person evaluation if the symptoms worsen or if the condition fails to improve as anticipated.     I provided 20 minutes total of non-face-to-face time during this encounter including median intraservice time, reviewing  previous notes, investigations, ordering medications, medical decision making, coordinating care and patient verbalized understanding at the end of the visit.    Phill Myron, D.O. Primary Care at [facility]  06/14/2019, 9:51 AM

## 2019-06-21 ENCOUNTER — Ambulatory Visit: Payer: 59

## 2019-06-28 ENCOUNTER — Ambulatory Visit
Admission: RE | Admit: 2019-06-28 | Discharge: 2019-06-28 | Disposition: A | Discharge status: Home / Self Care | Payer: 59 | Source: Ambulatory Visit | Attending: Family Medicine | Admitting: Family Medicine

## 2019-06-28 ENCOUNTER — Other Ambulatory Visit: Payer: Self-pay

## 2019-06-28 ENCOUNTER — Other Ambulatory Visit: Payer: Self-pay | Admitting: Internal Medicine

## 2019-06-28 DIAGNOSIS — Z1231 Encounter for screening mammogram for malignant neoplasm of breast: Secondary | ICD-10-CM

## 2019-08-02 ENCOUNTER — Encounter: Payer: Self-pay | Admitting: Gastroenterology

## 2019-08-02 ENCOUNTER — Ambulatory Visit: Payer: 59

## 2019-08-02 ENCOUNTER — Other Ambulatory Visit: Payer: Self-pay

## 2019-08-02 VITALS — Ht 65.0 in | Wt 225.0 lb

## 2019-08-02 DIAGNOSIS — Z1211 Encounter for screening for malignant neoplasm of colon: Secondary | ICD-10-CM

## 2019-08-02 MED ORDER — NA SULFATE-K SULFATE-MG SULF 17.5-3.13-1.6 GM/177ML PO SOLN: 1.0000 | Freq: Once | ORAL | 0 refills | Status: AC

## 2019-08-02 NOTE — Progress Notes (Signed)
Denies allergies to eggs or soy products. Denies complication of anesthesia or sedation. Denies use of weight loss medication. Denies use of O2.   Emmi instructions given for colonoscopy.  Covid vaccine was completed on 04/05/19.

## 2019-08-16 ENCOUNTER — Other Ambulatory Visit: Payer: Self-pay

## 2019-08-16 ENCOUNTER — Ambulatory Visit (AMBULATORY_SURGERY_CENTER): Payer: 59 | Admitting: Gastroenterology

## 2019-08-16 ENCOUNTER — Encounter: Payer: Self-pay | Admitting: Gastroenterology

## 2019-08-16 VITALS — BP 90/42 | HR 70 | Temp 98.0°F | Resp 12 | Ht 65.0 in | Wt 225.0 lb

## 2019-08-16 DIAGNOSIS — Z1211 Encounter for screening for malignant neoplasm of colon: Secondary | ICD-10-CM | POA: Diagnosis not present

## 2019-08-16 MED ORDER — SODIUM CHLORIDE 0.9 % IV SOLN: 500.0000 mL | INTRAVENOUS | Status: DC

## 2019-08-16 NOTE — Progress Notes (Signed)
PT taken to PACU. Monitors in place. VSS. Report given to RN. 

## 2019-08-16 NOTE — Patient Instructions (Signed)
YOU HAD AN ENDOSCOPIC PROCEDURE TODAY AT THE [facility]:   Refer to the procedure report that was given to you for any specific questions about what was found during the examination.  If the procedure report does not answer your questions, please call your gastroenterologist to clarify.  If you requested that your care partner not be given the details of your procedure findings, then the procedure report has been included in a sealed envelope for you to review at your convenience later.  YOU SHOULD EXPECT: Some feelings of bloating in the abdomen. Passage of more gas than usual.  Walking can help get rid of the air that was put into your GI tract during the procedure and reduce the bloating. If you had a lower endoscopy (such as a colonoscopy or flexible sigmoidoscopy) you may notice spotting of blood in your stool or on the toilet paper. If you underwent a bowel prep for your procedure, you may not have a normal bowel movement for a few days.  Please Note:  You might notice some irritation and congestion in your nose or some drainage.  This is from the oxygen used during your procedure.  There is no need for concern and it should clear up in a day or so.  SYMPTOMS TO REPORT IMMEDIATELY:   Following lower endoscopy (colonoscopy or flexible sigmoidoscopy):  Excessive amounts of blood in the stool  Significant tenderness or worsening of abdominal pains  Swelling of the abdomen that is new, acute  Fever of 100F or higher  For urgent or emergent issues, a gastroenterologist can be reached at any hour by calling [phone]. Do not use MyChart messaging for urgent concerns.    DIET:  We do recommend a small meal at first, but then you may proceed to your regular diet.  Drink plenty of fluids but you should avoid alcoholic beverages for 24 hours.  ACTIVITY:  You should plan to take it easy for the rest of today and you should NOT DRIVE or use heavy machinery until tomorrow (because  of the sedation medicines used during the test).    FOLLOW UP: Our staff will call the number listed on your records 48-72 hours following your procedure to check on you and address any questions or concerns that you may have regarding the information given to you following your procedure. If we do not reach you, we will leave a message.  We will attempt to reach you two times.  During this call, we will ask if you have developed any symptoms of COVID 19. If you develop any symptoms (ie: fever, flu-like symptoms, shortness of breath, cough etc.) before then, please call [phone].  If you test positive for Covid 19 in the 2 weeks post procedure, please call and report this information to us.    If any biopsies were taken you will be contacted by phone or by letter within the next 1-3 weeks.  Please call us at [phone] if you have not heard about the biopsies in 3 weeks.    SIGNATURES/CONFIDENTIALITY: You and/or your care partner have signed paperwork which will be entered into your electronic medical record.  These signatures attest to the fact that that the information above on your After Visit Summary has been reviewed and is understood.  Full responsibility of the confidentiality of this discharge information lies with you and/or your care-partner. 

## 2019-08-16 NOTE — Op Note (Signed)
[facility] Patient Name: Kristen White Procedure Date: 08/16/2019 10:23 AM MRN: [id number] Endoscopist: Rachael Fee , MD Age: 49 Referring MD:  Date of Birth: Dec 19, 1970 Gender: Female Account #: [id number] Procedure:                Colonoscopy Indications:              Screening for colorectal malignant neoplasm Medicines:                Monitored Anesthesia Care Procedure:                Pre-Anesthesia Assessment:                           - Prior to the procedure, a History and Physical                            was performed, and patient medications and                            allergies were reviewed. The patient's tolerance of                            previous anesthesia was also reviewed. The risks                            and benefits of the procedure and the sedation                            options and risks were discussed with the patient.                            All questions were answered, and informed consent                            was obtained. Prior Anticoagulants: The patient has                            taken no previous anticoagulant or antiplatelet                            agents. ASA Grade Assessment: II - A patient with                            mild systemic disease. After reviewing the risks                            and benefits, the patient was deemed in                            satisfactory condition to undergo the procedure.                           After obtaining informed consent, the colonoscope  was passed under direct vision. Throughout the                            procedure, the patient's blood pressure, pulse, and                            oxygen saturations were monitored continuously. The                            Colonoscope was introduced through the anus and                            advanced to the the terminal ileum, with                            identification of the  appendiceal orifice and IC                            valve. The colonoscopy was performed without                            difficulty. The patient tolerated the procedure                            well. The quality of the bowel preparation was                            good. The ileocecal valve, appendiceal orifice, and                            rectum were photographed. Scope In: 10:34:18 AM Scope Out: 10:45:17 AM Scope Withdrawal Time: 0 hours 8 minutes 7 seconds  Total Procedure Duration: 0 hours 10 minutes 59 seconds  Findings:                 The entire examined colon appeared normal on direct                            and retroflexion views. Complications:            No immediate complications. Estimated blood loss:                            None. Estimated Blood Loss:     Estimated blood loss: none. Impression:               - The entire examined colon is normal on direct and                            retroflexion views.                           - No polyps or cancers. Recommendation:           - Patient has a contact number available for  emergencies. The signs and symptoms of potential                            delayed complications were discussed with the                            patient. Return to normal activities tomorrow.                            Written discharge instructions were provided to the                            patient.                           - Resume previous diet.                           - Continue present medications.                           - Repeat colonoscopy in 10 years for screening. Rachael Fee, MD 08/16/2019 10:50:55 AM This report has been signed electronically.

## 2019-08-16 NOTE — Progress Notes (Signed)
Vs Oaklyn I have reviewed the patient's medical history in detail and updated the computerized patient record.  

## 2019-08-20 ENCOUNTER — Telehealth: Payer: Self-pay | Admitting: *Deleted

## 2019-08-20 NOTE — Telephone Encounter (Signed)
°  Follow up Call-  Call back number 08/16/2019  Post procedure Call Back phone  # [phone]  Permission to leave phone message Yes     Patient questions:  Do you have a fever, pain , or abdominal swelling? No. Pain Score  0 *  Have you tolerated food without any problems? Yes.    Have you been able to return to your normal activities? Yes.    Do you have any questions about your discharge instructions: Diet   No. Medications  No. Follow up visit  No.  Do you have questions or concerns about your Care? No.  Actions: * If pain score is 4 or above: No action needed, pain <4  1. Have you developed a fever since your procedure? NO  2.   Have you had an respiratory symptoms (SOB or cough) since your procedure? NO  3.   Have you tested positive for COVID 19 since your procedure NO  4.   Have you had any family members/close contacts diagnosed with the COVID 19 since your procedure?  NO   If yes to any of these questions please route to Laverna Peace, RN and Charlett Lango, RN

## 2019-09-13 DIAGNOSIS — H9011 Conductive hearing loss, unilateral, right ear, with unrestricted hearing on the contralateral side: Secondary | ICD-10-CM | POA: Diagnosis not present

## 2019-10-03 DIAGNOSIS — Z6836 Body mass index (BMI) 36.0-36.9, adult: Secondary | ICD-10-CM | POA: Diagnosis not present

## 2019-10-03 DIAGNOSIS — N951 Menopausal and female climacteric states: Secondary | ICD-10-CM | POA: Diagnosis not present

## 2019-10-03 DIAGNOSIS — E782 Mixed hyperlipidemia: Secondary | ICD-10-CM | POA: Diagnosis not present

## 2019-10-04 DIAGNOSIS — H908 Mixed conductive and sensorineural hearing loss, unspecified: Secondary | ICD-10-CM | POA: Diagnosis not present

## 2019-10-04 DIAGNOSIS — H905 Unspecified sensorineural hearing loss: Secondary | ICD-10-CM | POA: Diagnosis not present

## 2019-10-07 DIAGNOSIS — R635 Abnormal weight gain: Secondary | ICD-10-CM | POA: Diagnosis not present

## 2019-10-07 DIAGNOSIS — Z1331 Encounter for screening for depression: Secondary | ICD-10-CM | POA: Diagnosis not present

## 2019-10-07 DIAGNOSIS — Z1339 Encounter for screening examination for other mental health and behavioral disorders: Secondary | ICD-10-CM | POA: Diagnosis not present

## 2019-10-07 DIAGNOSIS — N951 Menopausal and female climacteric states: Secondary | ICD-10-CM | POA: Diagnosis not present

## 2019-10-07 DIAGNOSIS — Z6837 Body mass index (BMI) 37.0-37.9, adult: Secondary | ICD-10-CM | POA: Diagnosis not present

## 2019-10-07 DIAGNOSIS — M62830 Muscle spasm of back: Secondary | ICD-10-CM | POA: Diagnosis not present

## 2019-10-07 DIAGNOSIS — E782 Mixed hyperlipidemia: Secondary | ICD-10-CM | POA: Diagnosis not present

## 2019-10-07 DIAGNOSIS — H9191 Unspecified hearing loss, right ear: Secondary | ICD-10-CM | POA: Diagnosis not present

## 2019-10-18 DIAGNOSIS — Z6836 Body mass index (BMI) 36.0-36.9, adult: Secondary | ICD-10-CM | POA: Diagnosis not present

## 2019-10-18 DIAGNOSIS — E782 Mixed hyperlipidemia: Secondary | ICD-10-CM | POA: Diagnosis not present

## 2019-10-18 HISTORY — PX: IMPLANTATION BONE ANCHORED HEARING AID: SUR691

## 2019-10-25 DIAGNOSIS — E782 Mixed hyperlipidemia: Secondary | ICD-10-CM | POA: Diagnosis not present

## 2019-10-25 DIAGNOSIS — Z6836 Body mass index (BMI) 36.0-36.9, adult: Secondary | ICD-10-CM | POA: Diagnosis not present

## 2019-11-01 DIAGNOSIS — Z6836 Body mass index (BMI) 36.0-36.9, adult: Secondary | ICD-10-CM | POA: Diagnosis not present

## 2019-11-01 DIAGNOSIS — E782 Mixed hyperlipidemia: Secondary | ICD-10-CM | POA: Diagnosis not present

## 2019-11-04 DIAGNOSIS — Z01812 Encounter for preprocedural laboratory examination: Secondary | ICD-10-CM | POA: Diagnosis not present

## 2019-11-04 DIAGNOSIS — Z20822 Contact with and (suspected) exposure to covid-19: Secondary | ICD-10-CM | POA: Diagnosis not present

## 2019-11-07 DIAGNOSIS — H9041 Sensorineural hearing loss, unilateral, right ear, with unrestricted hearing on the contralateral side: Secondary | ICD-10-CM | POA: Diagnosis not present

## 2019-11-07 DIAGNOSIS — H905 Unspecified sensorineural hearing loss: Secondary | ICD-10-CM | POA: Diagnosis not present

## 2019-11-08 DIAGNOSIS — E782 Mixed hyperlipidemia: Secondary | ICD-10-CM | POA: Diagnosis not present

## 2019-11-08 DIAGNOSIS — Z6836 Body mass index (BMI) 36.0-36.9, adult: Secondary | ICD-10-CM | POA: Diagnosis not present

## 2019-11-15 DIAGNOSIS — E782 Mixed hyperlipidemia: Secondary | ICD-10-CM | POA: Diagnosis not present

## 2019-11-15 DIAGNOSIS — Z6836 Body mass index (BMI) 36.0-36.9, adult: Secondary | ICD-10-CM | POA: Diagnosis not present

## 2019-11-22 DIAGNOSIS — E782 Mixed hyperlipidemia: Secondary | ICD-10-CM | POA: Diagnosis not present

## 2019-11-22 DIAGNOSIS — M62830 Muscle spasm of back: Secondary | ICD-10-CM | POA: Diagnosis not present

## 2019-11-22 DIAGNOSIS — Z6836 Body mass index (BMI) 36.0-36.9, adult: Secondary | ICD-10-CM | POA: Diagnosis not present

## 2019-11-29 DIAGNOSIS — E782 Mixed hyperlipidemia: Secondary | ICD-10-CM | POA: Diagnosis not present

## 2019-11-29 DIAGNOSIS — Z6836 Body mass index (BMI) 36.0-36.9, adult: Secondary | ICD-10-CM | POA: Diagnosis not present

## 2019-12-26 ENCOUNTER — Ambulatory Visit (INDEPENDENT_AMBULATORY_CARE_PROVIDER_SITE_OTHER): Payer: 59 | Admitting: Internal Medicine

## 2019-12-26 ENCOUNTER — Encounter: Payer: Self-pay | Admitting: Internal Medicine

## 2019-12-26 ENCOUNTER — Other Ambulatory Visit: Payer: Self-pay

## 2019-12-26 VITALS — BP 124/85 | HR 82 | Temp 97.2°F | Resp 17 | Wt 216.0 lb

## 2019-12-26 DIAGNOSIS — R946 Abnormal results of thyroid function studies: Secondary | ICD-10-CM

## 2019-12-26 DIAGNOSIS — Z7689 Persons encountering health services in other specified circumstances: Secondary | ICD-10-CM | POA: Diagnosis not present

## 2019-12-26 NOTE — Progress Notes (Signed)
°  Subjective:    Kristen White - 49 y.o. female MRN [id number]  Date of birth: 08-05-1970  HPI  Kristen White is here for concerns about abnormal thyroid labs. Reports that she went to a weight loss clinic and had thyroid labs drawn. TSH, T3, and T4 were all within normal limits. However, anti-TPO was elevated. Was instructed to adhere to an anti-inflammatory diet. Wants to ensure her thyroid function is okay. No fatigue, changes in hair/nails, hot/cold intolerance, or unintended weight changes.      Health Maintenance:  Health Maintenance Due  Topic Date Due   COVID-19 Vaccine (2 - Pfizer 2-dose series) 04/05/2019   INFLUENZA VACCINE  08/18/2019    -  reports that she has never smoked. She has never used smokeless tobacco. - Review of Systems: Per HPI. - Past Medical History: Patient Active Problem List   Diagnosis Date Noted   Sensorineural hearing loss (SNHL) of right ear 04/07/2017   Seasonal allergies 04/06/2017   Acid reflux 04/06/2017   - Medications: reviewed and updated   Objective:   Physical Exam BP 124/85    Pulse 82    Temp (!) 97.2 F (36.2 C) (Temporal)    Resp 17    Wt 216 lb (98 kg)    SpO2 98%    BMI 35.94 kg/m  Physical Exam Constitutional:      General: She is not in acute distress.    Appearance: She is not diaphoretic.  HENT:     Head: Normocephalic and atraumatic.  Eyes:     Extraocular Movements: EOM normal.     Conjunctiva/sclera: Conjunctivae normal.  Neck:     Comments: No thyromegaly appreciated.  Cardiovascular:     Rate and Rhythm: Normal rate and regular rhythm.     Heart sounds: Normal heart sounds. No murmur heard.   Pulmonary:     Effort: Pulmonary effort is normal. No respiratory distress.     Breath sounds: Normal breath sounds.  Musculoskeletal:        General: Normal range of motion.     Cervical back: No tenderness.  Lymphadenopathy:     Cervical: No cervical adenopathy.  Skin:    General: Skin is warm and  dry.  Neurological:     Mental Status: She is alert and oriented to person, place, and time.  Psychiatric:        Mood and Affect: Affect normal.        Judgment: Judgment normal.            Assessment & Plan:   1. Thyroid function test abnormal Repeat labs for monitoring and to direct necessity of further work up/monitoring. 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  - Thyroid Panel With TSH - Thyroid Peroxidase Antibody      Marcy Siren, D.O. 12/26/2019, 3:12 PM Primary Care at [facility]

## 2019-12-27 LAB — THYROID PANEL WITH TSH
Free Thyroxine Index: 1.8 (ref 1.2–4.9)
T3 Uptake Ratio: 29 % (ref 24–39)
T4, Total: 6.2 ug/dL (ref 4.5–12.0)
TSH: 2.78 u[IU]/mL (ref 0.450–4.500)

## 2019-12-27 LAB — THYROID PEROXIDASE ANTIBODY: Thyroperoxidase Ab SerPl-aCnc: 282 IU/mL — ABNORMAL HIGH (ref 0–34)

## 2019-12-27 LAB — SPECIMEN STATUS REPORT

## 2020-01-30 ENCOUNTER — Ambulatory Visit: Payer: 59 | Admitting: Physician Assistant

## 2020-01-30 ENCOUNTER — Other Ambulatory Visit: Payer: Self-pay

## 2020-01-30 ENCOUNTER — Encounter: Payer: Self-pay | Admitting: Physician Assistant

## 2020-01-30 VITALS — BP 123/87 | HR 84 | Temp 98.3°F | Resp 18 | Ht 65.0 in | Wt 212.0 lb

## 2020-01-30 DIAGNOSIS — M62830 Muscle spasm of back: Secondary | ICD-10-CM

## 2020-01-30 MED ORDER — METHOCARBAMOL 500 MG PO TABS: 500.0000 mg | ORAL_TABLET | Freq: Three times a day (TID) | ORAL | 0 refills | Status: DC | PRN

## 2020-01-30 NOTE — Progress Notes (Signed)
Established Patient Office Visit  Subjective:  Patient ID: Kristen White, female    DOB: 03-23-1970  Age: 50 y.o. MRN: [id number]  CC:  Chief Complaint  Patient presents with   Medication Refill    Robaxin    HPI Kristen White reports that she will have intermittent lower right and lower left back spasms.  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  Reports that she will use ibuprofen for mild flareups with relief, however when it becomes severe Robaxin offers great relief without any adverse effects.  Reports that she recently had a more severe flareup a few days ago, but was not able to be seen at that time.  Reports that she has been working on stretching and strength training her core since her spasms began, states that that is beginning to offer relief.  Reports that she is drinking approximately 64 ounces of water a day.  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  Reports that she is very pleased with the results   Past Medical History:  Diagnosis Date   Allergy    Family history of colonic polyps    GERD (gastroesophageal reflux disease)    Heart murmur     Past Surgical History:  Procedure Laterality Date   IMPLANTATION BONE ANCHORED HEARING AID Right 10/2019   INNER EAR SURGERY Right    TENOLYSIS FOREARM / WRIST     WISDOM TOOTH EXTRACTION      Family History  Problem Relation Age of Onset   Early death Mother    Hypertension Father    Colon polyps Father    Alcohol abuse Maternal Uncle    Alcohol abuse Paternal Uncle    Colon polyps Paternal Uncle    Colon cancer Neg Hx    Stomach cancer Neg Hx    Rectal cancer Neg Hx    Esophageal cancer Neg Hx     Social History   Socioeconomic History   Marital status: Single    Spouse name: Not on file   Number of children: Not on file   Years of education: Not on file    Highest education level: Not on file  Occupational History   Occupation: Leisure centre manager  Tobacco Use   Smoking status: Never Smoker   Smokeless tobacco: Never Used  Building services engineer Use: Never used  Substance and Sexual Activity   Alcohol use: Yes    Comment: rare   Drug use: Never   Sexual activity: Not Currently    Birth control/protection: None  Other Topics Concern   Not on file  Social History Narrative   Not on file   Social Determinants of Health   Financial Resource Strain: Not on file  Food Insecurity: Not on file  Transportation Needs: Not on file  Physical Activity: Not on file  Stress: Not on file  Social Connections: Not on file  Intimate Partner Violence: Not on file    Outpatient Medications Prior to Visit  Medication Sig Dispense Refill   Ascorbic Acid (VITAMIN C) 100 MG tablet Take 100 mg by mouth daily.     cetirizine (ZYRTEC) 10 MG tablet Take 10 mg by mouth daily.     Cholecalciferol (D3-1000 PO) Take 1 each by mouth daily.     ibuprofen (ADVIL) 400 MG tablet Take 400 mg by mouth every 6 (six) hours  as needed. As needed     medroxyPROGESTERone Acetate 150 MG/ML SUSY INJECT 1 ML INTO THE MUSCLE ONCE FOR 1 DOSE. INJECT 1 ML INTO THE MUSCLE EVERY 3 MONTHS. 1 mL 3   Multiple Vitamin (MULTIVITAMIN) tablet Take 1 tablet by mouth daily.     mupirocin ointment (BACTROBAN) 2 % SMARTSIG:1 Application Topical 2-3 Times Daily     No facility-administered medications prior to visit.    Allergies  Allergen Reactions   No Known Allergies    Metronidazole Other (See Comments) and Nausea Only    "vertigo" Other reaction(s): Other (See Comments) "vertigo"    ROS Review of Systems  Constitutional: Negative.   HENT: Negative.   Eyes: Negative.   Respiratory: Negative.   Cardiovascular: Negative.   Gastrointestinal: Negative.   Endocrine: Negative.   Genitourinary: Negative.   Musculoskeletal: Positive for back pain.  Skin:  Negative.   Allergic/Immunologic: Negative.   Neurological: Negative.   Hematological: Negative.   Psychiatric/Behavioral: Negative.       Objective:    Physical Exam Vitals and nursing note reviewed.  Constitutional:      Appearance: Normal appearance.  HENT:     Head: Normocephalic and atraumatic.     Right Ear: External ear normal.     Left Ear: External ear normal.     Nose: Nose normal.     Mouth/Throat:     Mouth: Mucous membranes are moist.     Pharynx: Oropharynx is clear.  Eyes:     Extraocular Movements: Extraocular movements intact.     Conjunctiva/sclera: Conjunctivae normal.     Pupils: Pupils are equal, round, and reactive to light.  Cardiovascular:     Rate and Rhythm: Normal rate and regular rhythm.     Pulses: Normal pulses.     Heart sounds: Normal heart sounds.  Pulmonary:     Effort: Pulmonary effort is normal.     Breath sounds: Normal breath sounds.  Musculoskeletal:        General: Normal range of motion.     Cervical back: Normal, normal range of motion and neck supple.     Thoracic back: Normal. No spasms or tenderness.     Lumbar back: No spasms or tenderness.  Skin:    General: Skin is warm and dry.  Neurological:     General: No focal deficit present.     Mental Status: She is alert and oriented to person, place, and time. Mental status is at baseline.  Psychiatric:        Mood and Affect: Mood normal.        Behavior: Behavior normal.        Thought Content: Thought content normal.        Judgment: Judgment normal.     BP 123/87 (BP Location: Left Arm, Patient Position: Sitting, Cuff Size: Normal)    Pulse 84    Temp 98.3 F (36.8 C) (Oral)    Resp 18    Ht 5\' 5"  (1.651 m)    Wt 212 lb (96.2 kg)    SpO2 97%    BMI 35.28 kg/m  Wt Readings from Last 3 Encounters:  01/30/20 212 lb (96.2 kg)  12/26/19 216 lb (98 kg)  08/16/19 (!) 225 lb (102.1 kg)     There are no preventive care reminders to display for this patient.  There are  no preventive care reminders to display for this patient.  Lab Results  Component Value Date   TSH 2.780 12/26/2019  Lab Results  Component Value Date   WBC 7.7 06/03/2018   HGB 13.6 06/03/2018   HCT 42.8 06/03/2018   MCV 87.3 06/03/2018   PLT 314 06/03/2018   Lab Results  Component Value Date   NA 139 06/03/2018   K 3.4 (L) 06/03/2018   CO2 20 (L) 06/03/2018   GLUCOSE 104 (H) 06/03/2018   BUN 12 06/03/2018   CREATININE 0.90 06/03/2018   BILITOT 0.3 06/03/2018   ALKPHOS 62 06/03/2018   AST 15 06/03/2018   ALT 14 06/03/2018   PROT 7.5 06/03/2018   ALBUMIN 3.9 06/03/2018   CALCIUM 8.8 (L) 06/03/2018   ANIONGAP 9 06/03/2018   No results found for: CHOL No results found for: HDL No results found for: LDLCALC No results found for: TRIG No results found for: CHOLHDL No results found for: WLSL3T    Assessment & Plan:   Problem List Items Addressed This Visit      Other   Back spasm - Primary   Relevant Medications   methocarbamol (ROBAXIN) 500 MG tablet    1. Back spasm Continue Robaxin as needed, encourage patient to increase hydration on strength training days.  Red flag warnings given for prompt reevaluation - methocarbamol (ROBAXIN) 500 MG tablet; Take 1 tablet (500 mg total) by mouth every 8 (eight) hours as needed for muscle spasms.  Dispense: 20 tablet; Refill: 0  Meds ordered this encounter  Medications   methocarbamol (ROBAXIN) 500 MG tablet    Sig: Take 1 tablet (500 mg total) by mouth every 8 (eight) hours as needed for muscle spasms.    Dispense:  20 tablet    Refill:  0    Order Specific Question:   Supervising Provider    Answer:   Storm Frisk [[id number]]    I have reviewed the patient's medical history (PMH, PSH, Social History, Family History, Medications, and allergies) , and have been updated if relevant. I spent 30 minutes reviewing chart and  face to face time with patient.    Follow-up: Return if symptoms worsen or fail to improve.     Kasandra Knudsen Mayers, PA-C

## 2020-01-30 NOTE — Patient Instructions (Signed)
I have sent a refill of the muscle relaxer to your pharmacy.  I encourage you to increase your hydration levels on your strength training days.  It was a pleasure to meet you, please let us know if is anything else we can do for you.   Roney Jaffe, PA-C Physician Assistant [facility] [email or web address]    Low Back Sprain or Strain Rehab Ask your health care provider which exercises are safe for you. Do exercises exactly as told by your health care provider and adjust them as directed. It is normal to feel mild stretching, pulling, tightness, or discomfort as you do these exercises. Stop right away if you feel sudden pain or your pain gets worse. Do not begin these exercises until told by your health care provider. Stretching and range-of-motion exercises These exercises warm up your muscles and joints and improve the movement and flexibility of your back. These exercises also help to relieve pain, numbness, and tingling. Lumbar rotation 1. Lie on your back on a firm surface and bend your knees. 2. Straighten your arms out to your sides so each arm forms a 90-degree angle (right angle) with a side of your body. 3. Slowly move (rotate) both of your knees to one side of your body until you feel a stretch in your lower back (lumbar). Try not to let your shoulders lift off the floor. 4. Hold this position for __________ seconds. 5. Tense your abdominal muscles and slowly move your knees back to the starting position. 6. Repeat this exercise on the other side of your body. Repeat __________ times. Complete this exercise __________ times a day.   Single knee to chest 1. Lie on your back on a firm surface with both legs straight. 2. Bend one of your knees. Use your hands to move your knee up toward your chest until you feel a gentle stretch in your lower back and buttock. ? Hold your leg in this position by holding on to the front of your  knee. ? Keep your other leg as straight as possible. 3. Hold this position for __________ seconds. 4. Slowly return to the starting position. 5. Repeat with your other leg. Repeat __________ times. Complete this exercise __________ times a day.   Prone extension on elbows 1. Lie on your abdomen on a firm surface (prone position). 2. Prop yourself up on your elbows. 3. Use your arms to help lift your chest up until you feel a gentle stretch in your abdomen and your lower back. ? This will place some of your body weight on your elbows. If this is uncomfortable, try stacking pillows under your chest. ? Your hips should stay down, against the surface that you are lying on. Keep your hip and back muscles relaxed. 4. Hold this position for __________ seconds. 5. Slowly relax your upper body and return to the starting position. Repeat __________ times. Complete this exercise __________ times a day.   Strengthening exercises These exercises build strength and endurance in your back. Endurance is the ability to use your muscles for a long time, even after they get tired. Pelvic tilt This exercise strengthens the muscles that lie deep in the abdomen. 1. Lie on your back on a firm surface. Bend your knees and keep your feet flat on the floor. 2. Tense your abdominal muscles. Tip your pelvis up toward the ceiling and flatten your lower back into the floor. ? To help with this exercise, you may place a small towel  under your lower back and try to push your back into the towel. 3. Hold this position for __________ seconds. 4. Let your muscles relax completely before you repeat this exercise. Repeat __________ times. Complete this exercise __________ times a day. Alternating arm and leg raises 1. Get on your hands and knees on a firm surface. If you are on a hard floor, you may want to use padding, such as an exercise mat, to cushion your knees. 2. Line up your arms and legs. Your hands should be  directly below your shoulders, and your knees should be directly below your hips. 3. Lift your left leg behind you. At the same time, raise your right arm and straighten it in front of you. ? Do not lift your leg higher than your hip. ? Do not lift your arm higher than your shoulder. ? Keep your abdominal and back muscles tight. ? Keep your hips facing the ground. ? Do not arch your back. ? Keep your balance carefully, and do not hold your breath. 4. Hold this position for __________ seconds. 5. Slowly return to the starting position. 6. Repeat with your right leg and your left arm. Repeat __________ times. Complete this exercise __________ times a day.   Abdominal set with straight leg raise 1. Lie on your back on a firm surface. 2. Bend one of your knees and keep your other leg straight. 3. Tense your abdominal muscles and lift your straight leg up, 4-6 inches (10-15 cm) off the ground. 4. Keep your abdominal muscles tight and hold this position for __________ seconds. ? Do not hold your breath. ? Do not arch your back. Keep it flat against the ground. 5. Keep your abdominal muscles tense as you slowly lower your leg back to the starting position. 6. Repeat with your other leg. Repeat __________ times. Complete this exercise __________ times a day.   Single leg lower with bent knees 1. Lie on your back on a firm surface. 2. Tense your abdominal muscles and lift your feet off the floor, one foot at a time, so your knees and hips are bent in 90-degree angles (right angles). ? Your knees should be over your hips and your lower legs should be parallel to the floor. 3. Keeping your abdominal muscles tense and your knee bent, slowly lower one of your legs so your toe touches the ground. 4. Lift your leg back up to return to the starting position. ? Do not hold your breath. ? Do not let your back arch. Keep your back flat against the ground. 5. Repeat with your other leg. Repeat __________  times. Complete this exercise __________ times a day. Posture and body mechanics Good posture and healthy body mechanics can help to relieve stress in your body's tissues and joints. Body mechanics refers to the movements and positions of your body while you do your daily activities. Posture is part of body mechanics. Good posture means:  Your spine is in its natural S-curve position (neutral).  Your shoulders are pulled back slightly.  Your head is not tipped forward. Follow these guidelines to improve your posture and body mechanics in your everyday activities. Standing  When standing, keep your spine neutral and your feet about hip width apart. Keep a slight bend in your knees. Your ears, shoulders, and hips should line up.  When you do a task in which you stand in one place for a long time, place one foot up on a stable object that is  2-4 inches (5-10 cm) high, such as a footstool. This helps keep your spine neutral.   Sitting  When sitting, keep your spine neutral and keep your feet flat on the floor. Use a footrest, if necessary, and keep your thighs parallel to the floor. Avoid rounding your shoulders, and avoid tilting your head forward.  When working at a desk or a computer, keep your desk at a height where your hands are slightly lower than your elbows. Slide your chair under your desk so you are close enough to maintain good posture.  When working at a computer, place your monitor at a height where you are looking straight ahead and you do not have to tilt your head forward or downward to look at the screen.   Resting  When lying down and resting, avoid positions that are most painful for you.  If you have pain with activities such as sitting, bending, stooping, or squatting, lie in a position in which your body does not bend very much. For example, avoid curling up on your side with your arms and knees near your chest (fetal position).  If you have pain with activities such as  standing for a long time or reaching with your arms, lie with your spine in a neutral position and bend your knees slightly. Try the following positions: ? Lying on your side with a pillow between your knees. ? Lying on your back with a pillow under your knees. Lifting  When lifting objects, keep your feet at least shoulder width apart and tighten your abdominal muscles.  Bend your knees and hips and keep your spine neutral. It is important to lift using the strength of your legs, not your back. Do not lock your knees straight out.  Always ask for help to lift heavy or awkward objects.   This information is not intended to replace advice given to you by your health care provider. Make sure you discuss any questions you have with your health care provider. Document Revised: 04/27/2018 Document Reviewed: 01/25/2018 Elsevier Patient Education  2021 [employer].

## 2020-01-30 NOTE — Progress Notes (Signed)
Patient presents for a refill of Robaxin. Patient suffers from intermittent back spasms beginning May 2020. Patient is able to treat mild flare-ups with ibuprofen. Patient shares severe pain is controlled with Robaxin. Patient had a severe flare up 3 days ago. Patient has taken vitamins on today and had a chance to eat.

## 2020-01-31 DIAGNOSIS — M62830 Muscle spasm of back: Secondary | ICD-10-CM | POA: Insufficient documentation

## 2020-04-16 ENCOUNTER — Encounter: Payer: Self-pay | Admitting: Internal Medicine

## 2020-04-22 ENCOUNTER — Telehealth: Payer: Self-pay | Admitting: Internal Medicine

## 2020-04-22 NOTE — Telephone Encounter (Signed)
Pt requesting a note from PCP so pt can turn into work stating she is not on any restrictions. Pt asks for letter to be sent through [facility] CHART ASAP. Pt  highlights she needs to turn it in BEFORE 15TH. Please advise and thank you.

## 2020-04-24 NOTE — Telephone Encounter (Signed)
Pt states she is starting a new job & they are waiting for a letter from a medical provider stating she is not on any work restrictions due to her hx of back spasms. She needs medically cleared to work with a letter.  Pt states needs ASAP so she can start work next week. Pt [phone]

## 2020-04-26 NOTE — Telephone Encounter (Signed)
[place] please call patient and let her know that I am unable to write this type of letter without an office visit.  I only saw her for one office visit in January and this was actually for back spasms and was prescribed medication for back spasms

## 2020-04-27 ENCOUNTER — Other Ambulatory Visit: Payer: Self-pay

## 2020-04-27 ENCOUNTER — Encounter: Payer: Self-pay | Admitting: Physician Assistant

## 2020-04-27 ENCOUNTER — Telehealth: Payer: 59 | Admitting: Physician Assistant

## 2020-04-27 DIAGNOSIS — M62830 Muscle spasm of back: Secondary | ICD-10-CM

## 2020-04-27 NOTE — Patient Instructions (Signed)
Health Maintenance, Female Adopting a healthy lifestyle and getting preventive care are important in promoting health and wellness. Ask your health care provider about:  The right schedule for you to have regular tests and exams.  Things you can do on your own to prevent diseases and keep yourself healthy. What should I know about diet, weight, and exercise? Eat a healthy diet  Eat a diet that includes plenty of vegetables, fruits, low-fat dairy products, and lean protein.  Do not eat a lot of foods that are high in solid fats, added sugars, or sodium.   Maintain a healthy weight Body mass index (BMI) is used to identify weight problems. It estimates body fat based on height and weight. Your health care provider can help determine your BMI and help you achieve or maintain a healthy weight. Get regular exercise Get regular exercise. This is one of the most important things you can do for your health. Most adults should:  Exercise for at least 150 minutes each week. The exercise should increase your heart rate and make you sweat (moderate-intensity exercise).  Do strengthening exercises at least twice a week. This is in addition to the moderate-intensity exercise.  Spend less time sitting. Even light physical activity can be beneficial. Watch cholesterol and blood lipids Have your blood tested for lipids and cholesterol at 48 years of age, then have this test every 5 years. Have your cholesterol levels checked more often if:  Your lipid or cholesterol levels are high.  You are older than 48 years of age.  You are at high risk for heart disease. What should I know about cancer screening? Depending on your health history and family history, you may need to have cancer screening at various ages. This may include screening for:  Breast cancer.  Cervical cancer.  Colorectal cancer.  Skin cancer.  Lung cancer. What should I know about heart disease, diabetes, and high blood  pressure? Blood pressure and heart disease  High blood pressure causes heart disease and increases the risk of stroke. This is more likely to develop in people who have high blood pressure readings, are of African descent, or are overweight.  Have your blood pressure checked: ? Every 3-5 years if you are 18-39 years of age. ? Every year if you are 40 years old or older. Diabetes Have regular diabetes screenings. This checks your fasting blood sugar level. Have the screening done:  Once every three years after age 40 if you are at a normal weight and have a low risk for diabetes.  More often and at a younger age if you are overweight or have a high risk for diabetes. What should I know about preventing infection? Hepatitis B If you have a higher risk for hepatitis B, you should be screened for this virus. Talk with your health care provider to find out if you are at risk for hepatitis B infection. Hepatitis C Testing is recommended for:  Everyone born from 1945 through 1965.  Anyone with known risk factors for hepatitis C. Sexually transmitted infections (STIs)  Get screened for STIs, including gonorrhea and chlamydia, if: ? You are sexually active and are younger than 48 years of age. ? You are older than 48 years of age and your health care provider tells you that you are at risk for this type of infection. ? Your sexual activity has changed since you were last screened, and you are at increased risk for chlamydia or gonorrhea. Ask your health care provider   if you are at risk.  Ask your health care provider about whether you are at high risk for HIV. Your health care provider may recommend a prescription medicine to help prevent HIV infection. If you choose to take medicine to prevent HIV, you should first get tested for HIV. You should then be tested every 3 months for as long as you are taking the medicine. Pregnancy  If you are about to stop having your period (premenopausal) and  you may become pregnant, seek counseling before you get pregnant.  Take 400 to 800 micrograms (mcg) of folic acid every day if you become pregnant.  Ask for birth control (contraception) if you want to prevent pregnancy. Osteoporosis and menopause Osteoporosis is a disease in which the bones lose minerals and strength with aging. This can result in bone fractures. If you are 65 years old or older, or if you are at risk for osteoporosis and fractures, ask your health care provider if you should:  Be screened for bone loss.  Take a calcium or vitamin D supplement to lower your risk of fractures.  Be given hormone replacement therapy (HRT) to treat symptoms of menopause. Follow these instructions at home: Lifestyle  Do not use any products that contain nicotine or tobacco, such as cigarettes, e-cigarettes, and chewing tobacco. If you need help quitting, ask your health care provider.  Do not use street drugs.  Do not share needles.  Ask your health care provider for help if you need support or information about quitting drugs. Alcohol use  Do not drink alcohol if: ? Your health care provider tells you not to drink. ? You are pregnant, may be pregnant, or are planning to become pregnant.  If you drink alcohol: ? Limit how much you use to 0-1 drink a day. ? Limit intake if you are breastfeeding.  Be aware of how much alcohol is in your drink. In the [place] one drink equals one 12 oz bottle of beer (355 mL), one 5 oz glass of wine (148 mL), or one 1 oz glass of hard liquor (44 mL). General instructions  Schedule regular health, dental, and eye exams.  Stay current with your vaccines.  Tell your health care provider if: ? You often feel depressed. ? You have ever been abused or do not feel safe at home. Summary  Adopting a healthy lifestyle and getting preventive care are important in promoting health and wellness.  Follow your health care provider's instructions about healthy  diet, exercising, and getting tested or screened for diseases.  Follow your health care provider's instructions on monitoring your cholesterol and blood pressure. This information is not intended to replace advice given to you by your health care provider. Make sure you discuss any questions you have with your health care provider. Document Revised: 12/27/2017 Document Reviewed: 12/27/2017 Elsevier Patient Education  2021 [employer] Inc[employer]  

## 2020-04-27 NOTE — Telephone Encounter (Signed)
Patient has been scheduled a virtual visit to discuss the concern.

## 2020-04-27 NOTE — Progress Notes (Signed)
Established Patient Office Visit  Subjective:  Patient ID: Kristen White, female    DOB: 1970/02/27  Age: 50 y.o. MRN: [id number]  CC:  Chief Complaint  Patient presents with  . Letter for School/Work   Virtual Visit via Telephone Note  I connected with Kristen White on 04/27/20 at 10:20 AM EDT by telephone and verified that I am speaking with the correct person using two identifiers.  Location: Patient: Home Provider: [facility]    I discussed the limitations, risks, security and privacy concerns of performing an evaluation and management service by telephone and the availability of in person appointments. I also discussed with the patient that there may be a patient responsible charge related to this service. The patient expressed understanding and agreed to proceed.   History of Present Illness: Patient reports that she went for preemployment physical and was requested by her pending employer that she be given clearance from her medical provider that she does not have any restrictions regarding her history of back spasms.  Patient reiterated her history of back intermittent back spasms over the previous 2 years.  Attested that she has been working on lifestyle modifications which have improved the rate of occurrence, states that she has only had the need to take Robaxin once since January 2022.  No other concerns at this time   Observations/Objective: Medical history and current medications reviewed, no physical exam completed    Past Medical History:  Diagnosis Date  . Allergy   . Family history of colonic polyps   . GERD (gastroesophageal reflux disease)   . Heart murmur     Past Surgical History:  Procedure Laterality Date  . IMPLANTATION BONE ANCHORED HEARING AID Right 10/2019  . INNER EAR SURGERY Right   . TENOLYSIS FOREARM / WRIST    . WISDOM TOOTH EXTRACTION      Family History  Problem Relation Age of Onset  . Early death Mother    . Hypertension Father   . Colon polyps Father   . Alcohol abuse Maternal Uncle   . Alcohol abuse Paternal Uncle   . Colon polyps Paternal Uncle   . Colon cancer Neg Hx   . Stomach cancer Neg Hx   . Rectal cancer Neg Hx   . Esophageal cancer Neg Hx     Social History   Socioeconomic History  . Marital status: Single    Spouse name: Not on file  . Number of children: Not on file  . Years of education: Not on file  . Highest education level: Not on file  Occupational History  . Occupation: Leisure centre manager  Tobacco Use  . Smoking status: Never Smoker  . Smokeless tobacco: Never Used  Vaping Use  . Vaping Use: Never used  Substance and Sexual Activity  . Alcohol use: Yes    Comment: rare  . Drug use: Never  . Sexual activity: Not Currently    Birth control/protection: None  Other Topics Concern  . Not on file  Social History Narrative  . Not on file   Social Determinants of Health   Financial Resource Strain: Not on file  Food Insecurity: Not on file  Transportation Needs: Not on file  Physical Activity: Not on file  Stress: Not on file  Social Connections: Not on file  Intimate Partner Violence: Not on file    Outpatient Medications Prior to Visit  Medication Sig Dispense Refill  . Ascorbic Acid (VITAMIN C) 100 MG tablet  Take 100 mg by mouth daily.    . cetirizine (ZYRTEC) 10 MG tablet Take 10 mg by mouth daily.    . Cholecalciferol (D3-1000 PO) Take 1 each by mouth daily.    Marland Kitchen ibuprofen (ADVIL) 400 MG tablet Take 400 mg by mouth every 6 (six) hours as needed. As needed    . medroxyPROGESTERone Acetate 150 MG/ML SUSY INJECT 1 ML INTO THE MUSCLE ONCE FOR 1 DOSE. INJECT 1 ML INTO THE MUSCLE EVERY 3 MONTHS. 1 mL 3  . methocarbamol (ROBAXIN) 500 MG tablet Take 1 tablet (500 mg total) by mouth every 8 (eight) hours as needed for muscle spasms. 20 tablet 0  . Multiple Vitamin (MULTIVITAMIN) tablet Take 1 tablet by mouth daily.    . mupirocin ointment (BACTROBAN) 2 %  SMARTSIG:1 Application Topical 2-3 Times Daily     No facility-administered medications prior to visit.    Allergies  Allergen Reactions  . No Known Allergies   . Metronidazole Other (See Comments) and Nausea Only    "vertigo" Other reaction(s): Other (See Comments) "vertigo"    ROS Review of Systems  Constitutional: Negative.   HENT: Negative.   Eyes: Negative.   Respiratory: Negative.   Cardiovascular: Negative.   Gastrointestinal: Negative.   Endocrine: Negative.   Genitourinary: Negative.   Musculoskeletal: Negative for arthralgias, back pain and myalgias.  Skin: Negative.   Allergic/Immunologic: Negative.   Neurological: Negative.   Hematological: Negative.       Objective:      Wt Readings from Last 3 Encounters:  01/30/20 212 lb (96.2 kg)  12/26/19 216 lb (98 kg)  08/16/19 (!) 225 lb (102.1 kg)     There are no preventive care reminders to display for this patient.  There are no preventive care reminders to display for this patient.  Lab Results  Component Value Date   TSH 2.780 12/26/2019   Lab Results  Component Value Date   WBC 7.7 06/03/2018   HGB 13.6 06/03/2018   HCT 42.8 06/03/2018   MCV 87.3 06/03/2018   PLT 314 06/03/2018   Lab Results  Component Value Date   NA 139 06/03/2018   K 3.4 (L) 06/03/2018   CO2 20 (L) 06/03/2018   GLUCOSE 104 (H) 06/03/2018   BUN 12 06/03/2018   CREATININE 0.90 06/03/2018   BILITOT 0.3 06/03/2018   ALKPHOS 62 06/03/2018   AST 15 06/03/2018   ALT 14 06/03/2018   PROT 7.5 06/03/2018   ALBUMIN 3.9 06/03/2018   CALCIUM 8.8 (L) 06/03/2018   ANIONGAP 9 06/03/2018   No results found for: CHOL No results found for: HDL No results found for: LDLCALC No results found for: TRIG No results found for: CHOLHDL No results found for: OEUM3N    Assessment & Plan:   Problem List Items Addressed This Visit      Other   Back spasm - Primary      Assessment and Plan: 1. Back spasm Letter written on  patient's behalf.  No medication refills needed today.   Follow Up Instructions:    I discussed the assessment and treatment plan with the patient. The patient was provided an opportunity to ask questions and all were answered. The patient agreed with the plan and demonstrated an understanding of the instructions.   The patient was advised to call back or seek an in-person evaluation if the symptoms worsen or if the condition fails to improve as anticipated.  I provided 11 minutes of non-face-to-face time during this  encounter.    No orders of the defined types were placed in this encounter.   Follow-up: Return if symptoms worsen or fail to improve.    Kasandra Knudsen Mayers, PA-C

## 2020-04-27 NOTE — Progress Notes (Signed)
Patient verified DOB Patient has eaten and taken medication today. Patient denies pain at this time. Patient is working PRN for [employer] on Monday, during patients physical for employment she shared she has periodic back spasms and takes medication. Patient was then told a letter from her primary would need to be presented sharing that the spasms would not hinder her work.

## 2020-05-01 ENCOUNTER — Encounter: Payer: 59 | Admitting: Internal Medicine

## 2020-05-06 NOTE — Progress Notes (Signed)
Patient did not show for appointment.   

## 2020-05-08 ENCOUNTER — Encounter: Payer: 59 | Admitting: Family

## 2020-05-08 DIAGNOSIS — Z13220 Encounter for screening for lipoid disorders: Secondary | ICD-10-CM

## 2020-05-08 DIAGNOSIS — Z13228 Encounter for screening for other metabolic disorders: Secondary | ICD-10-CM

## 2020-05-08 DIAGNOSIS — Z131 Encounter for screening for diabetes mellitus: Secondary | ICD-10-CM

## 2020-05-08 DIAGNOSIS — Z1329 Encounter for screening for other suspected endocrine disorder: Secondary | ICD-10-CM

## 2020-05-08 DIAGNOSIS — Z130 Encounter for screening for diseases of the blood and blood-forming organs and certain disorders involving the immune mechanism: Secondary | ICD-10-CM

## 2020-05-08 DIAGNOSIS — Z0000 Encounter for general adult medical examination without abnormal findings: Secondary | ICD-10-CM

## 2020-05-11 ENCOUNTER — Ambulatory Visit: Payer: 59

## 2020-05-11 ENCOUNTER — Telehealth: Payer: Self-pay | Admitting: Internal Medicine

## 2020-05-11 NOTE — Telephone Encounter (Signed)
Please call patient to schedule an nurse visit appt. Will need urine HCG before administering Depo shot.

## 2020-05-11 NOTE — Telephone Encounter (Signed)
Pt states she is 2 weeks late for her shot. Needs medication refilled medroxyPROGESTERone Acetate 150 MG/ML SUSY [[id number]]   Pharmacy  [facility] [address] [place] - [address]  [address] [place] [address]  Phone:  [phone] Fax:  [phone]

## 2020-05-12 ENCOUNTER — Ambulatory Visit (INDEPENDENT_AMBULATORY_CARE_PROVIDER_SITE_OTHER): Payer: 59

## 2020-05-12 ENCOUNTER — Other Ambulatory Visit: Payer: Self-pay

## 2020-05-12 DIAGNOSIS — Z3042 Encounter for surveillance of injectable contraceptive: Secondary | ICD-10-CM

## 2020-05-12 LAB — POCT URINE PREGNANCY: Preg Test, Ur: NEGATIVE

## 2020-05-12 NOTE — Progress Notes (Signed)
Patient came in the office for Depo Shot.  Informed that Depot shot has not been given at this office and would need the MD/PCP  to place orders so that it could be administered. Advised to f/u at her appt on May 9 with PCP. Feels that she could administer her shot quarterly. Informed patient that this could she could discuss details about self administration then.

## 2020-05-25 ENCOUNTER — Encounter: Payer: Self-pay | Admitting: Internal Medicine

## 2020-05-25 ENCOUNTER — Ambulatory Visit (INDEPENDENT_AMBULATORY_CARE_PROVIDER_SITE_OTHER): Payer: 59 | Admitting: Internal Medicine

## 2020-05-25 ENCOUNTER — Other Ambulatory Visit: Payer: Self-pay

## 2020-05-25 VITALS — BP 109/78 | HR 103 | Temp 97.3°F | Resp 16 | Wt 206.0 lb

## 2020-05-25 DIAGNOSIS — Z13228 Encounter for screening for other metabolic disorders: Secondary | ICD-10-CM

## 2020-05-25 DIAGNOSIS — Z0000 Encounter for general adult medical examination without abnormal findings: Secondary | ICD-10-CM | POA: Diagnosis not present

## 2020-05-25 DIAGNOSIS — Z3042 Encounter for surveillance of injectable contraceptive: Secondary | ICD-10-CM

## 2020-05-25 DIAGNOSIS — Z130 Encounter for screening for diseases of the blood and blood-forming organs and certain disorders involving the immune mechanism: Secondary | ICD-10-CM

## 2020-05-25 LAB — POCT URINE PREGNANCY: Preg Test, Ur: NEGATIVE

## 2020-05-25 MED ORDER — MEDROXYPROGESTERONE ACETATE 150 MG/ML IM SUSP
150.0000 mg | Freq: Once | INTRAMUSCULAR | Status: AC
  Administered 2020-05-25: 150 mg via INTRAMUSCULAR

## 2020-05-25 MED ORDER — MEDROXYPROGESTERONE ACETATE 150 MG/ML IM SUSY: PREFILLED_SYRINGE | INTRAMUSCULAR | 3 refills | Status: DC

## 2020-05-25 NOTE — Progress Notes (Signed)
Subjective:    KINDRA BICKHAM - 50 y.o. female MRN [id number]  Date of birth: 1970-03-06  HPI  ELVIN BANKER is here for annual exam. She has no concerns today. Is using DepoProvera for contraception. Is a month behind. Would like to have it administered in office today and then starting giving it at home like she has been doing for past 2 years. 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      Health Maintenance:  There are no preventive care reminders to display for this patient.  -  reports that she has never smoked. She has never used smokeless tobacco. - Review of Systems: Per HPI. - Past Medical History: Patient Active Problem List   Diagnosis Date Noted  . Back spasm 01/31/2020  . Sensorineural hearing loss (SNHL) of right ear 04/07/2017  . Seasonal allergies 04/06/2017  . Acid reflux 04/06/2017   - Medications: reviewed and updated   Objective:   Physical Exam BP 109/78   Pulse (!) 103   Temp (!) 97.3 F (36.3 C)   Resp 16   Wt 206 lb (93.4 kg)   LMP 05/20/2020   SpO2 98%   BMI 34.28 kg/m  Physical Exam Constitutional:      Appearance: She is not diaphoretic.  HENT:     Head: Normocephalic and atraumatic.  Eyes:     Conjunctiva/sclera: Conjunctivae normal.     Pupils: Pupils are equal, round, and reactive to light.  Neck:     Thyroid: No thyromegaly.  Cardiovascular:     Rate and Rhythm: Normal rate and regular rhythm.     Heart sounds: Normal heart sounds. No murmur heard.   Pulmonary:     Effort: Pulmonary effort is normal. No respiratory distress.     Breath sounds: Normal breath sounds. No wheezing.  Abdominal:     General: Bowel sounds are normal. There is no distension.     Palpations: Abdomen is soft.     Tenderness: There is no abdominal tenderness. There is no guarding or rebound.  Musculoskeletal:        General: No  deformity. Normal range of motion.     Cervical back: Normal range of motion and neck supple.  Lymphadenopathy:     Cervical: No cervical adenopathy.  Skin:    General: Skin is warm and dry.     Findings: No rash.  Neurological:     Mental Status: She is alert and oriented to person, place, and time.     Gait: Gait is intact.  Psychiatric:        Mood and Affect: Mood and affect normal.        Judgment: Judgment normal.            Assessment & Plan:   1. Encounter for annual physical exam Counseled on 150 minutes of exercise per week, healthy eating (including decreased daily intake of saturated fats, cholesterol, added sugars, sodium), STI prevention, routine healthcare maintenance.  2. Depo-Provera contraceptive status Upreg neg. Depo injection given today. Will send in Rx so patient can give at home in the future.  - POCT urine pregnancy - medroxyPROGESTERone Acetate 150 MG/ML SUSY; INJECT 1 ML INTO THE MUSCLE ONCE FOR 1 DOSE. INJECT 1 ML INTO THE MUSCLE EVERY 3 MONTHS.  Dispense: 1 mL; Refill: 3  3. Screening for metabolic disorder -  Lipid panel - Comprehensive metabolic panel  4. Screening for deficiency anemia - CBC     Marcy Siren, D.O. 05/25/2020, 2:37 PM Primary Care at [facility]

## 2020-05-25 NOTE — Patient Instructions (Signed)
Preventive Care 48-48 Years Old, Female Preventive care refers to lifestyle choices and visits with your health care provider that can promote health and wellness. This includes:  A yearly physical exam. This is also called an annual wellness visit.  Regular dental and eye exams.  Immunizations.  Screening for certain conditions.  Healthy lifestyle choices, such as: ? Eating a healthy diet. ? Getting regular exercise. ? Not using drugs or products that contain nicotine and tobacco. ? Limiting alcohol use. What can I expect for my preventive care visit? Physical exam Your health care provider will check your:  Height and weight. These may be used to calculate your BMI (body mass index). BMI is a measurement that tells if you are at a healthy weight.  Heart rate and blood pressure.  Body temperature.  Skin for abnormal spots. Counseling Your health care provider may ask you questions about your:  Past medical problems.  Family's medical history.  Alcohol, tobacco, and drug use.  Emotional well-being.  Home life and relationship well-being.  Sexual activity.  Diet, exercise, and sleep habits.  Work and work Statistician.  Access to firearms.  Method of birth control.  Menstrual cycle.  Pregnancy history. What immunizations do I need? Vaccines are usually given at various ages, according to a schedule. Your health care provider will recommend vaccines for you based on your age, medical history, and lifestyle or other factors, such as travel or where you work.   What tests do I need? Blood tests  Lipid and cholesterol levels. These may be checked every 5 years, or more often if you are over 3 years old.  Hepatitis C test.  Hepatitis B test. Screening  Lung cancer screening. You may have this screening every year starting at age 48 if you have a 30-pack-year history of smoking and currently smoke or have quit within the past 15 years.  Colorectal cancer  screening. ? All adults should have this screening starting at age 48 and continuing until age 17. ? Your health care provider may recommend screening at age 48 if you are at increased risk. ? You will have tests every 1-10 years, depending on your results and the type of screening test.  Diabetes screening. ? This is done by checking your blood sugar (glucose) after you have not eaten for a while (fasting). ? You may have this done every 1-3 years.  Mammogram. ? This may be done every 1-2 years. ? Talk with your health care provider about when you should start having regular mammograms. This may depend on whether you have a family history of breast cancer.  BRCA-related cancer screening. This may be done if you have a family history of breast, ovarian, tubal, or peritoneal cancers.  Pelvic exam and Pap test. ? This may be done every 3 years starting at age 48. ? Starting at age 48, this may be done every 5 years if you have a Pap test in combination with an HPV test. Other tests  STD (sexually transmitted disease) testing, if you are at risk.  Bone density scan. This is done to screen for osteoporosis. You may have this scan if you are at high risk for osteoporosis. Talk with your health care provider about your test results, treatment options, and if necessary, the need for more tests. Follow these instructions at home: Eating and drinking  Eat a diet that includes fresh fruits and vegetables, whole grains, lean protein, and low-fat dairy products.  Take vitamin and mineral supplements  as recommended by your health care provider.  Do not drink alcohol if: ? Your health care provider tells you not to drink. ? You are pregnant, may be pregnant, or are planning to become pregnant.  If you drink alcohol: ? Limit how much you have to 0-1 drink a day. ? Be aware of how much alcohol is in your drink. In the [place] one drink equals one 12 oz bottle of beer (355 mL), one 5 oz glass of  wine (148 mL), or one 1 oz glass of hard liquor (44 mL).   Lifestyle  Take daily care of your teeth and gums. Brush your teeth every morning and night with fluoride toothpaste. Floss one time each day.  Stay active. Exercise for at least 30 minutes 5 or more days each week.  Do not use any products that contain nicotine or tobacco, such as cigarettes, e-cigarettes, and chewing tobacco. If you need help quitting, ask your health care provider.  Do not use drugs.  If you are sexually active, practice safe sex. Use a condom or other form of protection to prevent STIs (sexually transmitted infections).  If you do not wish to become pregnant, use a form of birth control. If you plan to become pregnant, see your health care provider for a prepregnancy visit.  If told by your health care provider, take low-dose aspirin daily starting at age 48.  Find healthy ways to cope with stress, such as: ? Meditation, yoga, or listening to music. ? Journaling. ? Talking to a trusted person. ? Spending time with friends and family. Safety  Always wear your seat belt while driving or riding in a vehicle.  Do not drive: ? If you have been drinking alcohol. Do not ride with someone who has been drinking. ? When you are tired or distracted. ? While texting.  Wear a helmet and other protective equipment during sports activities.  If you have firearms in your house, make sure you follow all gun safety procedures. What's next?  Visit your health care provider once a year for an annual wellness visit.  Ask your health care provider how often you should have your eyes and teeth checked.  Stay up to date on all vaccines. This information is not intended to replace advice given to you by your health care provider. Make sure you discuss any questions you have with your health care provider. Document Revised: 10/08/2019 Document Reviewed: 09/14/2017 Elsevier Patient Education  2021 Elsevier Inc.  

## 2020-05-25 NOTE — Progress Notes (Signed)
Depo injection

## 2020-05-26 LAB — CBC
Hematocrit: 44.5 % (ref 34.0–46.6)
Hemoglobin: 15.1 g/dL (ref 11.1–15.9)
MCH: 28.7 pg (ref 26.6–33.0)
MCHC: 33.9 g/dL (ref 31.5–35.7)
MCV: 85 fL (ref 79–97)
Platelets: 345 10*3/uL (ref 150–450)
RBC: 5.26 x10E6/uL (ref 3.77–5.28)
RDW: 13 % (ref 11.7–15.4)
WBC: 6.1 10*3/uL (ref 3.4–10.8)

## 2020-05-26 LAB — COMPREHENSIVE METABOLIC PANEL
ALT: 22 IU/L (ref 0–32)
AST: 21 IU/L (ref 0–40)
Albumin/Globulin Ratio: 1.6 (ref 1.2–2.2)
Albumin: 4.5 g/dL (ref 3.8–4.8)
Alkaline Phosphatase: 73 IU/L (ref 44–121)
BUN/Creatinine Ratio: 10 (ref 9–23)
BUN: 9 mg/dL (ref 6–24)
Bilirubin Total: 0.4 mg/dL (ref 0.0–1.2)
CO2: 18 mmol/L — ABNORMAL LOW (ref 20–29)
Calcium: 9.5 mg/dL (ref 8.7–10.2)
Chloride: 106 mmol/L (ref 96–106)
Creatinine, Ser: 0.91 mg/dL (ref 0.57–1.00)
Globulin, Total: 2.9 g/dL (ref 1.5–4.5)
Glucose: 87 mg/dL (ref 65–99)
Potassium: 4 mmol/L (ref 3.5–5.2)
Sodium: 142 mmol/L (ref 134–144)
Total Protein: 7.4 g/dL (ref 6.0–8.5)
eGFR: 78 mL/min/{1.73_m2} (ref >59)

## 2020-05-26 LAB — LIPID PANEL
Chol/HDL Ratio: 3.4 ratio (ref 0.0–4.4)
Cholesterol, Total: 195 mg/dL (ref 100–199)
HDL: 57 mg/dL (ref >39)
LDL Chol Calc (NIH): 123 mg/dL — ABNORMAL HIGH (ref 0–99)
Triglycerides: 85 mg/dL (ref 0–149)
VLDL Cholesterol Cal: 15 mg/dL (ref 5–40)

## 2020-06-02 ENCOUNTER — Other Ambulatory Visit: Payer: Self-pay | Admitting: Internal Medicine

## 2020-06-02 DIAGNOSIS — Z1231 Encounter for screening mammogram for malignant neoplasm of breast: Secondary | ICD-10-CM

## 2020-06-05 ENCOUNTER — Other Ambulatory Visit: Payer: Self-pay

## 2020-06-05 ENCOUNTER — Ambulatory Visit
Admission: RE | Admit: 2020-06-05 | Discharge: 2020-06-05 | Disposition: A | Discharge status: Home / Self Care | Payer: 59 | Source: Ambulatory Visit

## 2020-06-05 DIAGNOSIS — Z1231 Encounter for screening mammogram for malignant neoplasm of breast: Secondary | ICD-10-CM

## 2020-06-19 DIAGNOSIS — H9041 Sensorineural hearing loss, unilateral, right ear, with unrestricted hearing on the contralateral side: Secondary | ICD-10-CM | POA: Diagnosis not present

## 2020-06-26 DIAGNOSIS — R238 Other skin changes: Secondary | ICD-10-CM | POA: Diagnosis not present

## 2020-07-23 ENCOUNTER — Encounter: Payer: Self-pay | Admitting: Physician Assistant

## 2020-07-24 ENCOUNTER — Encounter: Payer: Self-pay | Admitting: Emergency Medicine

## 2020-07-24 ENCOUNTER — Ambulatory Visit
Admission: EM | Admit: 2020-07-24 | Discharge: 2020-07-24 | Disposition: A | Discharge status: Home / Self Care | Payer: 59 | Attending: Emergency Medicine | Admitting: Emergency Medicine

## 2020-07-24 ENCOUNTER — Other Ambulatory Visit: Payer: Self-pay

## 2020-07-24 DIAGNOSIS — M7711 Lateral epicondylitis, right elbow: Secondary | ICD-10-CM | POA: Diagnosis not present

## 2020-07-24 MED ORDER — PREDNISONE 10 MG PO TABS: ORAL_TABLET | ORAL | 0 refills | Status: DC

## 2020-07-24 NOTE — ED Triage Notes (Addendum)
Pt presents today with c/o of right elbow pain/discomfort that she believes might be from lifting small weights at home. Denies injury. She has been wearing a brace to help reduce the pain. x45 days

## 2020-07-24 NOTE — Discharge Instructions (Addendum)
Please read attached on tennis elbow, may try at home exercises provided Begin prednisone taper x6 days-begin with 6 tablets on day 1, decrease by 1 tablet each day until complete-6, 5, 4, 3, 2, 1-take with food and earlier in the day if possible After completion of prednisone may return to using ibuprofen/Aleve as needed for discomfort Physical therapy referral placed Sports medicine contact below-please call for follow-up appointment

## 2020-07-24 NOTE — ED Provider Notes (Signed)
[facility]    CSN: [id number] Arrival date & time: 07/24/20  0806      History   Chief Complaint Chief Complaint  Patient presents with   Elbow Pain    right    HPI TOMMA EHINGER is a 50 y.o. female presenting today for evaluation of elbow pain.  Reports began to develop right elbow pain approximately 1 to 2 months ago.  Reports initially mild, but symptoms have progressed to affecting daily activities/work.  She denies specific injury or fall.  Does report that she often will work out with weights, but she is gradually increased weights over the past 2 years.  Pain is mainly located to the outside.  Denies any numbness or tingling.  Denies any wrist or hand pain.  Has been using a an elbow strap for approximately 1 to 3 hours at a time which has helped some, but not full relief.  HPI  Past Medical History:  Diagnosis Date   Allergy    Family history of colonic polyps    GERD (gastroesophageal reflux disease)    Heart murmur     Patient Active Problem List   Diagnosis Date Noted   Back spasm 01/31/2020   Sensorineural hearing loss (SNHL) of right ear 04/07/2017   Seasonal allergies 04/06/2017   Acid reflux 04/06/2017    Past Surgical History:  Procedure Laterality Date   IMPLANTATION BONE ANCHORED HEARING AID Right 10/2019   INNER EAR SURGERY Right    TENOLYSIS FOREARM / WRIST     WISDOM TOOTH EXTRACTION      OB History   No obstetric history on file.      Home Medications    Prior to Admission medications   Medication Sig Start Date End Date Taking? Authorizing Provider  predniSONE (DELTASONE) 10 MG tablet Begin with 6 tabs on day 1, 5 tab on day 2, 4 tab on day 3, 3 tab on day 4, 2 tab on day 5, 1 tab on day 6-take with food 07/24/20  Yes Pinchos Topel C, PA-C  Ascorbic Acid (VITAMIN C) 100 MG tablet Take 100 mg by mouth daily.    [provider]  cetirizine (ZYRTEC) 10 MG tablet Take 10 mg by mouth daily.    [provider]  Cholecalciferol (D3-1000 PO) Take 1 each by mouth daily.    [provider]  ibuprofen (ADVIL) 400 MG tablet Take 400 mg by mouth every 6 (six) hours as needed. As needed    [provider]  medroxyPROGESTERone Acetate 150 MG/ML SUSY INJECT 1 ML INTO THE MUSCLE ONCE FOR 1 DOSE. INJECT 1 ML INTO THE MUSCLE EVERY 3 MONTHS. 05/25/20   Arvilla Market, MD  methocarbamol (ROBAXIN) 500 MG tablet Take 1 tablet (500 mg total) by mouth every 8 (eight) hours as needed for muscle spasms. 01/30/20   Mayers, Cari S, PA-C  Multiple Vitamin (MULTIVITAMIN) tablet Take 1 tablet by mouth daily.    [provider]  mupirocin ointment (BACTROBAN) 2 % SMARTSIG:1 Application Topical 2-3 Times Daily 12/27/19   [provider]    Family History Family History  Problem Relation Age of Onset   Early death Mother    Hypertension Father    Colon polyps Father    Alcohol abuse Maternal Uncle    Alcohol abuse Paternal Uncle    Colon polyps Paternal Uncle    Colon cancer Neg Hx    Stomach cancer Neg Hx    Rectal cancer  Neg Hx    Esophageal cancer Neg Hx     Social History Social History   Tobacco Use   Smoking status: Never   Smokeless tobacco: Never  Vaping Use   Vaping Use: Never used  Substance Use Topics   Alcohol use: Yes    Comment: rare   Drug use: Never     Allergies   Other, No known allergies, and Metronidazole   Review of Systems Review of Systems  Constitutional:  Negative for fatigue and fever.  Eyes:  Negative for visual disturbance.  Respiratory:  Negative for shortness of breath.   Cardiovascular:  Negative for chest pain.  Gastrointestinal:  Negative for abdominal pain, nausea and vomiting.  Musculoskeletal:  Positive for arthralgias. Negative for joint swelling.  Skin:  Negative for color change, rash and wound.  Neurological:  Negative for dizziness, weakness, light-headedness and headaches.    Physical Exam Triage Vital  Signs ED Triage Vitals  Enc Vitals Group     BP      Pulse      Resp      Temp      Temp src      SpO2      Weight      Height      Head Circumference      Peak Flow      Pain Score      Pain Loc      Pain Edu?      Excl. in GC?    No data found.  Updated Vital Signs BP 115/87 (BP Location: Left Arm)   Pulse 96   Temp 98.7 F (37.1 C) (Oral)   Resp 18   SpO2 98%   Visual Acuity Right Eye Distance:   Left Eye Distance:   Bilateral Distance:    Right Eye Near:   Left Eye Near:    Bilateral Near:     Physical Exam Vitals and nursing note reviewed.  Constitutional:      Appearance: She is well-developed.     Comments: No acute distress  HENT:     Head: Normocephalic and atraumatic.     Nose: Nose normal.  Eyes:     Conjunctiva/sclera: Conjunctivae normal.  Cardiovascular:     Rate and Rhythm: Normal rate.  Pulmonary:     Effort: Pulmonary effort is normal. No respiratory distress.  Abdominal:     General: There is no distension.  Musculoskeletal:        General: Normal range of motion.     Cervical back: Neck supple.     Comments: Right elbow: No obvious swelling deformity or discoloration, no erythema or warmth, nontender to palpation to medial aspect, nontender over olecranon, mild tenderness to lateral epicondyle area and into proximal forearm, nontender over distal radius and ulna, radial pulse 2+, sensation intact distally  Skin:    General: Skin is warm and dry.  Neurological:     Mental Status: She is alert and oriented to person, place, and time.     UC Treatments / Results  Labs (all labs ordered are listed, but only abnormal results are displayed) Labs Reviewed - No data to display  EKG   Radiology No results found.  Procedures Procedures (including critical care time)  Medications Ordered in UC Medications - No data to display  Initial Impression / Assessment and Plan / UC Course  I have reviewed the triage vital signs and the  nursing notes.  Pertinent labs & imaging results  that were available during my care of the patient were reviewed by me and considered in my medical decision making (see chart for details).     Suspect lateral epicondylitis-encouraged use of elbow strap for periods longer than 1 to 3 hours, has been using NSAIDs without full relief, trial of prednisone as alternative anti-inflammatory, provided at home exercises, referral placed to PT and encouraged follow-up with sports medicine given persistent symptoms as well.  Discussed strict return precautions. Patient verbalized understanding and is agreeable with plan.  Final Clinical Impressions(s) / UC Diagnoses   Final diagnoses:  Lateral epicondylitis of right elbow     Discharge Instructions      Please read attached on tennis elbow, may try at home exercises provided Begin prednisone taper x6 days-begin with 6 tablets on day 1, decrease by 1 tablet each day until complete-6, 5, 4, 3, 2, 1-take with food and earlier in the day if possible After completion of prednisone may return to using ibuprofen/Aleve as needed for discomfort Physical therapy referral placed Sports medicine contact below-please call for follow-up appointment     ED Prescriptions     Medication Sig Dispense Auth. Provider   predniSONE (DELTASONE) 10 MG tablet Begin with 6 tabs on day 1, 5 tab on day 2, 4 tab on day 3, 3 tab on day 4, 2 tab on day 5, 1 tab on day 6-take with food 21 tablet Kecia Swoboda C, PA-C      PDMP not reviewed this encounter.   Lew Dawes, [place] 07/24/20 [phone]

## 2020-07-31 ENCOUNTER — Ambulatory Visit: Payer: 59 | Admitting: Family Medicine

## 2020-08-14 ENCOUNTER — Other Ambulatory Visit: Payer: Self-pay

## 2020-08-14 ENCOUNTER — Ambulatory Visit: Payer: 59 | Attending: Emergency Medicine

## 2020-08-14 DIAGNOSIS — M6281 Muscle weakness (generalized): Secondary | ICD-10-CM | POA: Insufficient documentation

## 2020-08-14 DIAGNOSIS — M25521 Pain in right elbow: Secondary | ICD-10-CM | POA: Diagnosis not present

## 2020-08-14 NOTE — Therapy (Signed)
[facility] [address] [place], [place], [address] Phone: [phone]   Fax:  [phone]  Physical Therapy Evaluation  Patient Details  Name: Kristen White MRN: [id number] Date of Birth: 03/07/1970 Referring Provider (PT): Lew Dawes   Encounter Date: 08/14/2020   PT End of Session - 08/14/20 1302     Visit Number 1    Number of Visits 8    Date for PT Re-Evaluation 10/09/20    Authorization Type [facility]    Progress Note Due on Visit 8    PT Start Time 1215    PT Stop Time 1300    PT Time Calculation (min) 45 min    Activity Tolerance Patient tolerated treatment well    Behavior During Therapy [facility] for tasks assessed/performed             Past Medical History:  Diagnosis Date   Allergy    Family history of colonic polyps    GERD (gastroesophageal reflux disease)    Heart murmur     Past Surgical History:  Procedure Laterality Date   IMPLANTATION BONE ANCHORED HEARING AID Right 10/2019   INNER EAR SURGERY Right    TENOLYSIS FOREARM / WRIST     WISDOM TOOTH EXTRACTION      There were no vitals filed for this visit.    Subjective Assessment - 08/14/20 1220     Subjective Pt reports to clinic with insidious onset of R lateral elbow pain starting about 2 months ago. She reports working out in her house regularly the past 2 years, gradually increasing her resistance over that that time. She reports that the pain began shortly after her most recent increase in resistance. She wears an epicodylalgia strap that she purchased on [employer] to manage the pain and has since decreased her resistance with her lifting exercises. Her primary UE weight lifts include bicep curls, tricep extension, and bent-over rows. She reports that she swims on Mondays and Fridays, does resistance training on Tuesdays, Thursdays, and Saturdays. She denies any N/T or weakness related to this problem. Cancer red flag screening negative. She  reports that she has an appointment with sports medicine on Monday.    Limitations Lifting;House hold activities    How long can you sit comfortably? Unlimited    How long can you stand comfortably? Unlimited    How long can you walk comfortably? 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 Yes    Pain Location Elbow    Pain Orientation Right;Lateral    Pain Descriptors / Indicators Aching;Sore    Pain Type Acute pain    Pain Onset More than a month ago    Pain Frequency Intermittent    Aggravating Factors  Weight-lifting, moving patients    Pain Relieving Factors Elbow strap, ibuprofen    Effect of Pain on Daily Activities Unable to lift weights, move patients without pain    Multiple Pain Sites No                [facility] PT Assessment - 08/14/20 0001       Assessment   Medical Diagnosis R elbow pain    Referring Provider (PT) Sharyon Cable, Fran Lowes C    Onset Date/Surgical Date 06/14/20    Hand Dominance Right    Next MD Visit 08/17/2020    Prior Therapy Yes, for plantar fasciitis, was beneficial  Precautions   Precautions None      Restrictions   Weight Bearing Restrictions No      Balance Screen   Has the patient fallen in the past 6 months No    Has the patient had a decrease in activity level because of a fear of falling?  No    Is the patient reluctant to leave their home because of a fear of falling?  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    Leisure Working out, sewing, reading      Cognition   Overall Cognitive Status Within Functional Limits for tasks assessed      Observation/Other Assessments   Focus on Therapeutic Outcomes (FOTO)  65%, Expected 75% by visit 8      ROM / Strength   AROM / PROM / Strength --   BIL UE AROM WNL     Strength   Strength Assessment Site Hand    Right Elbow Flexion 5/5    Right Elbow Extension 5/5    Left Elbow Flexion 5/5    Left Elbow Extension 5/5    Right Forearm Pronation 4/5   painful (5/10)   Right Forearm Supination 3+/5   Very painful (7/10)   Left Forearm Pronation 5/5    Left Forearm Supination 5/5    Right Wrist Flexion 5/5    Right Wrist Extension 4/5   painful (6/10)   Left Wrist Flexion 5/5    Left Wrist Extension 5/5    Right Hand Grip (lbs) 58lbs    Left Hand Grip (lbs) 75lbs      Palpation   Palpation comment Mild TTP to R common extensor tendon      Special Tests   Other special tests R wrist extensor stretch not painful                        Objective measurements completed on examination: See above findings.               PT Education - 08/14/20 1302     Education Details Pt educated on likely physiology behind her primary symptoms. Also educated on proper form when performing new exercises.    Person(s) Educated Patient    Methods Explanation;Demonstration;Handout    Comprehension Verbalized understanding;Returned demonstration              PT Short Term Goals - 08/14/20 1310       PT SHORT TERM GOAL #1   Title Pt will report understanding and adherence to her HEP in order to promote independence in the management of her primary symptoms.    Baseline HEP given at baseline    Time 4    Period Weeks    Status New    Target Date 09/11/20               PT Long  Term Goals - 08/14/20 1311       PT LONG TERM GOAL #1   Title Pt will achieve a FOTO score of 75% in order to  demonstrate improved functional ability in regard to her elbow pain.    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    Baseline R forearm supination MMT = 3+/5 with 7/10 pain; R forearm pronation MMT = 4/5 with 5/10 pain    Time 8    Period Weeks    Status New    Target Date 10/09/20      PT LONG TERM GOAL #3   Title Pt will demonstrate R wrist extension MMT of 5/5 with 0-1/10 pain in order to return to performing UE lifts without limitation.    Baseline MMT = 4/5 with 6/10 pain    Time 8    Period Weeks    Status New    Target Date 10/09/20      PT LONG TERM GOAL #4   Title Pt will demonstrate R power grip strength = L in order to open jars without limitation.    Baseline R= 58lbs; L = 75lbs    Time 8    Period Weeks    Status New    Target Date 10/09/20                    Plan - 08/14/20 1303     Clinical Impression Statement The pt is a pleasant 50yo F who presents with primary c/o subacute R lateral elbow pain of insidious onset. Upon assessment, her primary impairments include painful and weak R wrist extension and forearm pronation, weak and very painful forearm supination, and mild TTP to R common extensor tendon. Ruling up lateral epicondylalgia due to these findings, along with report of repetitive HIIT workouts involving her BUE and a recent increase in resistance. She will benefit from skilled PT to address her primary impairments and return to her prior level of function without limitation due to pain.    Examination-Activity Limitations Caring for Others;Lift    Examination-Participation Restrictions Occupation;Community Activity    Stability/Clinical Decision Making Stable/Uncomplicated    Clinical Decision Making Low    Rehab Potential  Excellent    PT Frequency 1x / week    PT Duration 8 weeks    PT Treatment/Interventions ADLs/Self Care Home Management;Biofeedback;Cryotherapy;Electrical Stimulation;Ultrasound;Moist Heat;Therapeutic activities;Therapeutic exercise;Neuromuscular re-education;Manual techniques;Splinting;Joint Manipulations;Dry needling;Passive range of motion;Taping    PT Next Visit Plan Progress wrist extension/ forearm supination/ pronation strengthening, consider heat vs. therapeutic [place] for pain management    PT Home Exercise Plan [id number]    Consulted and Agree with Plan of Care Patient             Patient will benefit from skilled therapeutic intervention in order to improve the following deficits and impairments:  Impaired UE functional use, Pain, Decreased strength  Visit Diagnosis: Pain in right elbow  Muscle weakness (generalized)     Problem List Patient Active Problem List   Diagnosis Date Noted   Back spasm 01/31/2020   Sensorineural hearing loss (SNHL) of right ear 04/07/2017   Seasonal allergies 04/06/2017   Acid reflux 04/06/2017  Carmelina Dane, PT, DPT 08/14/20 1:18 PM   [facility] [address] [place], [place], [address] Phone: [phone]   Fax:  [phone]  Name: Kristen White MRN: [id number] Date of Birth: 09-May-1970

## 2020-08-14 NOTE — Patient Instructions (Signed)
  [id number] 

## 2020-08-17 ENCOUNTER — Ambulatory Visit: Payer: Self-pay

## 2020-08-17 ENCOUNTER — Encounter: Payer: Self-pay | Admitting: Family Medicine

## 2020-08-17 ENCOUNTER — Ambulatory Visit (INDEPENDENT_AMBULATORY_CARE_PROVIDER_SITE_OTHER): Payer: 59 | Admitting: Family Medicine

## 2020-08-17 ENCOUNTER — Other Ambulatory Visit: Payer: Self-pay

## 2020-08-17 VITALS — BP 120/78 | Ht 65.0 in | Wt 204.0 lb

## 2020-08-17 DIAGNOSIS — M7711 Lateral epicondylitis, right elbow: Secondary | ICD-10-CM

## 2020-08-17 DIAGNOSIS — M25521 Pain in right elbow: Secondary | ICD-10-CM

## 2020-08-17 NOTE — Assessment & Plan Note (Signed)
Clinical exam suggestive of lateral spondylitis.  No significant structural changes on ultrasound. -Counseled on home exercise therapy and supportive care. -Continue physical therapy. -Counseled on Voltaren. -Could consider injection or shockwave therapy.

## 2020-08-17 NOTE — Patient Instructions (Signed)
Nice to meet you Please try ice as needed  Please try voltaren  Please continue physical therapy   Please send me a message in MyChart with any questions or updates.  Please see me back in 4 weeks or as needed if better.   --Dr. Jordan Likes

## 2020-08-17 NOTE — Progress Notes (Signed)
Kristen White - 50 y.o. female MRN [id number]  Date of birth: [birth year]  SUBJECTIVE:  Including CC & ROS.  No chief complaint on file.   Kristen White is a 50 y.o. female that is presenting with right lateral elbow pain.  Pain is been present for about 2 months.  Did get improvement with prednisone and has started physical therapy.  Pain started after she was working with some dumbbells and increased weight.  No history of similar pain.    Review of Systems See HPI   HISTORY: Past Medical, Surgical, Social, and Family History Reviewed & Updated per EMR.   Pertinent Historical Findings include:  Past Medical History:  Diagnosis Date   Allergy    Family history of colonic polyps    GERD (gastroesophageal reflux disease)    Heart murmur     Past Surgical History:  Procedure Laterality Date   IMPLANTATION BONE ANCHORED HEARING AID Right 10/2019   INNER EAR SURGERY Right    TENOLYSIS FOREARM / WRIST     WISDOM TOOTH EXTRACTION      Family History  Problem Relation Age of Onset   Early death Mother    Hypertension Father    Colon polyps Father    Alcohol abuse Maternal Uncle    Alcohol abuse Paternal Uncle    Colon polyps Paternal Uncle    Colon cancer Neg Hx    Stomach cancer Neg Hx    Rectal cancer Neg Hx    Esophageal cancer Neg Hx     Social History   Socioeconomic History   Marital status: Single    Spouse name: Not on file   Number of children: Not on file   Years of education: Not on file   Highest education level: Not on file  Occupational History   Occupation: Leisure centre manager  Tobacco Use   Smoking status: Never   Smokeless tobacco: Never  Vaping Use   Vaping Use: Never used  Substance and Sexual Activity   Alcohol use: Yes    Comment: rare   Drug use: Never   Sexual activity: Not Currently    Birth control/protection: None  Other Topics Concern   Not on file  Social History Narrative   Not on file   Social Determinants of Health    Financial Resource Strain: Not on file  Food Insecurity: Not on file  Transportation Needs: Not on file  Physical Activity: Not on file  Stress: Not on file  Social Connections: Not on file  Intimate Partner Violence: Not on file     PHYSICAL EXAM:  VS: BP 120/78 (BP Location: Left Arm, Patient Position: Sitting, Cuff Size: Large)   Ht 5\' 5"  (1.651 m)   Wt 204 lb (92.5 kg)   BMI 33.95 kg/m  Physical Exam Gen: NAD, alert, cooperative with exam, well-appearing MSK:  Right elbow: Normal range of motion. Tenderness to palpation over the lateral condyle. Pain with resistance to meeting finger extension. Neurovascularly intact  Limited ultrasound: Right elbow:  No effusion within the joint space. Mild hypoechoic changes at the origin of the extensor tendons. Normal-appearing radial nerve. No changes in the mid belly of the muscles.  Summary: Findings suggestive of lateral epicondylitis  Ultrasound and interpretation by , MD    ASSESSMENT & PLAN:   Lateral epicondylitis of right elbow Clinical exam suggestive of lateral spondylitis.  No significant structural changes on ultrasound. -Counseled on home exercise therapy and supportive care. -Continue physical therapy. -Counseled on  Voltaren. -Could consider injection or shockwave therapy.

## 2020-08-20 ENCOUNTER — Other Ambulatory Visit: Payer: Self-pay

## 2020-08-20 ENCOUNTER — Ambulatory Visit: Payer: 59 | Attending: Emergency Medicine

## 2020-08-20 DIAGNOSIS — M25521 Pain in right elbow: Secondary | ICD-10-CM | POA: Insufficient documentation

## 2020-08-20 DIAGNOSIS — M6281 Muscle weakness (generalized): Secondary | ICD-10-CM | POA: Insufficient documentation

## 2020-08-20 NOTE — Therapy (Signed)
[facility] [address] [place], [place], [address] Phone: [phone]   Fax:  [phone]  Physical Therapy Treatment  Patient Details  Name: Kristen White MRN: [id number] Date of Birth: 05-Jul-1970 Referring Provider (PT): Lew Dawes   Encounter Date: 08/20/2020   PT End of Session - 08/20/20 1825     Visit Number 2    Number of Visits 8    Date for PT Re-Evaluation 10/09/20    Authorization Type [facility]    Progress Note Due on Visit 8    PT Start Time 1745    PT Stop Time 1825    PT Time Calculation (min) 40 min    Activity Tolerance Patient tolerated treatment well    Behavior During Therapy [facility] for tasks assessed/performed             Past Medical History:  Diagnosis Date   Allergy    Family history of colonic polyps    GERD (gastroesophageal reflux disease)    Heart murmur     Past Surgical History:  Procedure Laterality Date   IMPLANTATION BONE ANCHORED HEARING AID Right 10/2019   INNER EAR SURGERY Right    TENOLYSIS FOREARM / WRIST     WISDOM TOOTH EXTRACTION      There were no vitals filed for this visit.   Subjective Assessment - 08/20/20 1746     Subjective Pt reports performing her HEP daily, adding that she has taken an ibuprofen after a couple of her exercises due to increase in sxs. However, she states that she can tell the exercises are beginning to help. She reports that she is ready to initiate the session today.    Currently in Pain? No/denies    Pain Score 0-No pain                               [facility] Adult PT Treatment/Exercise - 08/20/20 0001       Elbow Exercises   Forearm Supination Strengthening   2x10 on R with yellow therabar   Forearm Pronation Strengthening   2x10 with yellow therabar     Wrist Exercises   Wrist Extension Strengthening   2x10 eccentric lowering   Bar Weights/Barbell (Wrist Extension) 5 lbs    Wrist Radial Deviation  Strengthening   2x10 with GTB   Theraband Level (Radial Deviation) Level 3 (Green)      Modalities   Modalities Cryotherapy      Cryotherapy   Number Minutes Cryotherapy 10 Minutes    Cryotherapy Location --   R lateral elbow   Type of Cryotherapy Ice massage                    PT Education - 08/20/20 1812     Education Details Pt educated on proper form when performing new exercises.    Person(s) Educated Patient    Methods Explanation;Demonstration;Handout    Comprehension Verbalized understanding;Returned demonstration              PT Short Term Goals - 08/14/20 1310       PT SHORT TERM GOAL #1   Title Pt will report understanding and adherence to her HEP in order to promote independence in the management of her primary symptoms.    Baseline HEP given at baseline    Time 4    Period Weeks    Status New  Target Date 09/11/20               PT Long Term Goals - 08/14/20 1311       PT LONG TERM GOAL #1   Title Pt will achieve a FOTO score of 75% in order to demonstrate improved functional ability in regard to her elbow pain.    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    Baseline R forearm supination MMT = 3+/5 with 7/10 pain; R forearm pronation MMT = 4/5 with 5/10 pain    Time 8    Period Weeks    Status New    Target Date 10/09/20      PT LONG TERM GOAL #3   Title Pt will demonstrate R wrist extension MMT of 5/5 with 0-1/10 pain in order to return to performing UE lifts without limitation.    Baseline MMT = 4/5 with 6/10 pain    Time 8    Period Weeks    Status New    Target Date 10/09/20      PT LONG TERM GOAL #4   Title Pt will demonstrate R power grip strength = L in order to open jars without limitation.     Baseline R= 58lbs; L = 75lbs    Time 8    Period Weeks    Status New    Target Date 10/09/20                   Plan - 08/20/20 1826     Clinical Impression Statement The pt responded well to all interventions today with proper form and no increase in pain with any exercises. She also reports a decrease in sxs following ice massage. She will continue to benefit from skilled PT to address her primary impairments and return to her prior level of function without limitation.    Examination-Activity Limitations Caring for Others;Lift    Examination-Participation Restrictions Occupation;Community Activity    Stability/Clinical Decision Making Stable/Uncomplicated    Clinical Decision Making Low    Rehab Potential Excellent    PT Frequency 1x / week    PT Duration 8 weeks    PT Treatment/Interventions ADLs/Self Care Home Management;Biofeedback;Cryotherapy;Electrical Stimulation;Ultrasound;Moist Heat;Therapeutic activities;Therapeutic exercise;Neuromuscular re-education;Manual techniques;Splinting;Joint Manipulations;Dry needling;Passive range of motion;Taping    PT Next Visit Plan Progress wrist extension/ forearm supination/ pronation strengthening, consider ice massage vs. therapeutic [place] for pain management    PT Home Exercise Plan [id number]    Consulted and Agree with Plan of Care Patient             Patient will benefit from skilled therapeutic intervention in order to improve the following deficits and impairments:  Impaired UE functional use, Pain, Decreased strength  Visit Diagnosis: Pain in right elbow  Muscle weakness (generalized)     Problem List Patient Active Problem List   Diagnosis Date Noted   Lateral epicondylitis of right elbow 08/17/2020   Back spasm 01/31/2020   Sensorineural hearing loss (SNHL) of right ear 04/07/2017   Seasonal allergies 04/06/2017   Acid reflux 04/06/2017    Carmelina Dane, PT, DPT 08/20/20 6:29 PM   [facility] [phone]  [address] [place], [place], [address] Phone: [phone]   Fax:  [phone]  Name: Kristen White MRN: [id number] Date of Birth: 27-Aug-1970

## 2020-08-20 NOTE — Patient Instructions (Signed)
  [id number]

## 2020-08-28 ENCOUNTER — Ambulatory Visit: Payer: 59

## 2020-08-28 ENCOUNTER — Other Ambulatory Visit: Payer: Self-pay

## 2020-08-28 DIAGNOSIS — M6281 Muscle weakness (generalized): Secondary | ICD-10-CM

## 2020-08-28 DIAGNOSIS — M25521 Pain in right elbow: Secondary | ICD-10-CM

## 2020-08-28 NOTE — Therapy (Signed)
[facility] [address] [place], [place], [address] Phone: [phone]   Fax:  [phone]  Physical Therapy Treatment  Patient Details  Name: Kristen White MRN: [id number] Date of Birth: Apr 21, 1970 Referring Provider (PT): Lew Dawes   Encounter Date: 08/28/2020   PT End of Session - 08/28/20 0900     Visit Number 3    Number of Visits 8    Date for PT Re-Evaluation 10/09/20    Authorization Type [facility]    Progress Note Due on Visit 8    PT Start Time 0830    PT Stop Time 0915    PT Time Calculation (min) 45 min    Activity Tolerance Patient tolerated treatment well    Behavior During Therapy [facility] for tasks assessed/performed             Past Medical History:  Diagnosis Date   Allergy    Family history of colonic polyps    GERD (gastroesophageal reflux disease)    Heart murmur     Past Surgical History:  Procedure Laterality Date   IMPLANTATION BONE ANCHORED HEARING AID Right 10/2019   INNER EAR SURGERY Right    TENOLYSIS FOREARM / WRIST     WISDOM TOOTH EXTRACTION      There were no vitals filed for this visit.   Subjective Assessment - 08/28/20 0826     Subjective Pt reports that she can tell a difference in her concordant pain since starting PT, stating that the pain isn't as often or as severe. She adds that she wears her epicondylitis brace daily, only taking it off at night. She has been adherent with her HEP and has not returned to other weighted lifts yet, opting to wait for PT approval.    Currently in Pain? No/denies    Pain Score 0-No pain    Pain Location Elbow    Pain Orientation Right;Lateral    Pain Descriptors / Indicators Aching;Sore    Pain Type Acute pain;Chronic pain    Pain Onset More than a month ago    Pain Frequency Intermittent                               [facility] Adult PT Treatment/Exercise - 08/28/20 0001       Elbow Exercises   Elbow  Extension Strengthening   standing tricep extension with 20# cable 3x10   Forearm Supination Strengthening   2x10 on R with yellow therabar   Forearm Pronation Strengthening   2x10 on R with yellow therabar   Other elbow exercises Pronated biceps curls 3x10 6# DB's    Other elbow exercises Knee plank hands to elbows 2x8      Modalities   Modalities Cryotherapy      Cryotherapy   Number Minutes Cryotherapy 10 Minutes    Cryotherapy Location --   R lateral elbow   Type of Cryotherapy Ice massage                    PT Education - 08/28/20 0859     Education Details Pt educated on proper form when performing new exercises.    Person(s) Educated Patient    Methods Explanation;Demonstration;Handout    Comprehension Verbalized understanding;Returned demonstration              PT Short Term Goals - 08/28/20 0915       PT SHORT TERM  GOAL #1   Title Pt will report understanding and adherence to her HEP in order to promote independence in the management of her primary symptoms.    Baseline Pt reports daily HEP adherence    Time 4    Period Weeks    Status Achieved    Target Date 09/11/20               PT Long Term Goals - 08/14/20 1311       PT LONG TERM GOAL #1   Title Pt will achieve a FOTO score of 75% in order to demonstrate improved functional ability in regard to her elbow pain.    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    Baseline R forearm supination MMT = 3+/5 with 7/10 pain; R forearm pronation MMT = 4/5 with 5/10 pain    Time 8    Period Weeks    Status New    Target Date 10/09/20      PT LONG TERM GOAL #3   Title Pt will demonstrate R wrist extension MMT of 5/5 with 0-1/10 pain in order to return to performing UE lifts  without limitation.    Baseline MMT = 4/5 with 6/10 pain    Time 8    Period Weeks    Status New    Target Date 10/09/20      PT LONG TERM GOAL #4   Title Pt will demonstrate R power grip strength = L in order to open jars without limitation.    Baseline R= 58lbs; L = 75lbs    Time 8    Period Weeks    Status New    Target Date 10/09/20                   Plan - 08/28/20 0900     Clinical Impression Statement The pt responded well to all interventions today with proper form and no increase in pain with any exercises. She again reports a decrease in sxs following ice massage. She will continue to benefit from skilled PT to address her primary impairments and return to her prior level of function without limitation.    Examination-Activity Limitations Caring for Others;Lift    Examination-Participation Restrictions Occupation;Community Activity    Stability/Clinical Decision Making Stable/Uncomplicated    Clinical Decision Making Low    Rehab Potential Excellent    PT Frequency 1x / week    PT Duration 8 weeks    PT Treatment/Interventions ADLs/Self Care Home Management;Biofeedback;Cryotherapy;Electrical Stimulation;Ultrasound;Moist Heat;Therapeutic activities;Therapeutic exercise;Neuromuscular re-education;Manual techniques;Splinting;Joint Manipulations;Dry needling;Passive range of motion;Taping    PT Next Visit Plan Progress wrist extension/ forearm supination/ pronation strengthening, consider ice massage vs. therapeutic [place] for pain management    PT Home Exercise Plan [id number]    Consulted and Agree with Plan of Care Patient             Patient will benefit from skilled therapeutic intervention in order to improve the following deficits and impairments:  Impaired UE functional use, Pain, Decreased strength  Visit Diagnosis: Pain in right elbow  Muscle weakness (generalized)     Problem List Patient Active Problem List   Diagnosis Date Noted  Lateral  epicondylitis of right elbow 08/17/2020   Back spasm 01/31/2020   Sensorineural hearing loss (SNHL) of right ear 04/07/2017   Seasonal allergies 04/06/2017   Acid reflux 04/06/2017    Carmelina Dane, PT, DPT 08/28/20 9:16 AM   [facility] [address] [place], [place], [address] Phone: [phone]   Fax:  [phone]  Name: AMBERLE LYTER MRN: [id number] Date of Birth: 1970-06-13

## 2020-08-28 NOTE — Patient Instructions (Signed)
  [id number] 

## 2020-09-07 ENCOUNTER — Other Ambulatory Visit: Payer: Self-pay

## 2020-09-07 ENCOUNTER — Ambulatory Visit: Payer: 59

## 2020-09-07 DIAGNOSIS — M25521 Pain in right elbow: Secondary | ICD-10-CM | POA: Diagnosis not present

## 2020-09-07 DIAGNOSIS — M6281 Muscle weakness (generalized): Secondary | ICD-10-CM

## 2020-09-07 NOTE — Therapy (Signed)
[place], [place], [address] Phone: [phone]   Fax:  [phone]  Physical Therapy Treatment/ Discharge Summary  Patient Details  Name: Kristen White MRN: [id number] Date of Birth: 1970-10-05 Referring Provider (PT): Janith Lima   Encounter Date: 09/07/2020   PT End of Session - 09/07/20 1722     Visit Number 4    Number of Visits 8    Date for PT Re-Evaluation 10/09/20    Authorization Type [facility]    Progress Note Due on Visit 8    PT Start Time 4580    PT Stop Time 1720    PT Time Calculation (min) 25 min    Activity Tolerance Patient tolerated treatment well    Behavior During Therapy [facility] for tasks assessed/performed             Past Medical History:  Diagnosis Date   Allergy    Family history of colonic polyps    GERD (gastroesophageal reflux disease)    Heart murmur     Past Surgical History:  Procedure Laterality Date   IMPLANTATION BONE ANCHORED HEARING AID Right 10/2019   INNER EAR SURGERY Right    TENOLYSIS FOREARM / WRIST     WISDOM TOOTH EXTRACTION      There were no vitals filed for this visit.   Subjective Assessment - 09/07/20 1654     Subjective Pt reports having no pain for several days in a row. She reports that exercises have been going well and that she feels independent in the management of her sxs moving forward. She also adds that she has successfully weaned off of her epiconylitis strap and no longer needs it for pain reduction.    Currently in Pain? No/denies    Pain Score 0-No pain                [facility] PT Assessment - 09/07/20 0001       Observation/Other Assessments   Focus on Therapeutic Outcomes (FOTO)  69%      Strength   Right Forearm Pronation 5/5    Right Forearm Supination 5/5   1/10 pain   Right Wrist Extension 5/5    Right Hand Grip (lbs) 58lbs      Palpation   Palpation comment No TTP to R common extensor tendon                                    PT Education - 09/07/20 1722     Education Details Reviewed pt's HEP with her with focus on form and progression.    Person(s) Educated Patient    Methods Explanation;Demonstration    Comprehension Verbalized understanding;Returned demonstration              PT Short Term Goals - 08/28/20 0915       PT SHORT TERM GOAL #1   Title Pt will report understanding and adherence to her HEP in order to promote independence in the management of her primary symptoms.    Baseline Pt reports daily HEP adherence    Time 4    Period Weeks    Status Achieved    Target Date 09/11/20               PT Long Term Goals - 09/07/20 1726       PT LONG TERM GOAL #1   Title  Pt will achieve a FOTO score of 75% in order to demonstrate improved functional ability in regard to her elbow pain.    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    Baseline 5/5 each; no pain with pronation, 1/10 pain with supination    Time 8    Period Weeks    Status Achieved      PT LONG TERM GOAL #3   Title Pt will demonstrate R wrist extension MMT of 5/5 with 0-1/10 pain in order to return to performing UE lifts without limitation.    Baseline 5/5 with 0/10 pain    Time 8    Period Weeks    Status Achieved      PT LONG TERM GOAL #4   Title Pt will demonstrate R power grip strength = L in order to open jars without limitation.    Baseline R= 58lbs; L = 75lbs    Time 8    Period Weeks    Status Not Met                   Plan - 09/07/20 1723     Clinical Impression Statement Upon reassessment of objective data, the pt has made excellent progress in forearm and wrist strength with little to no pain, a stark contrast to 7/10  pain experienced at eval with these resisted movements. She remains limited in R power grip strength, although this does not result in any functional limitations for the pt. Additionally, the pt has made good progress in her FOTO score through 4 visits, although the overall FOTO goal was not met. Due to the pt feeling satisfied with the results of PT, along with meeting or partially meeting her goals of PT, the pt is discharged from PT at this time.    Examination-Activity Limitations Caring for Others;Lift    Examination-Participation Restrictions Occupation;Community Activity    Stability/Clinical Decision Making Stable/Uncomplicated    Clinical Decision Making Low    PT Next Visit Plan Pt is discharged from PT at this time.    PT Home Exercise Plan [id number]    Consulted and Agree with Plan of Care Patient             Patient will benefit from skilled therapeutic intervention in order to improve the following deficits and impairments:  Impaired UE functional use, Pain, Decreased strength  Visit Diagnosis: Pain in right elbow  Muscle weakness (generalized)     Problem List Patient Active Problem List   Diagnosis Date Noted   Lateral epicondylitis of right elbow 08/17/2020   Back spasm 01/31/2020   Sensorineural hearing loss (SNHL) of right ear 04/07/2017   Seasonal allergies 04/06/2017   Acid reflux 04/06/2017     [facility] [place], [place], [address] Phone: [phone]   Fax:  [phone]  Name: Kristen White MRN: [id number] Date of Birth: 07/23/1970  PHYSICAL THERAPY DISCHARGE SUMMARY  Visits from Start of Care: 4  Current functional level related to goals / functional outcomes: Pt has met or partially met most of her stated goals of PT.   Remaining deficits: Pt remains limited in R power grip strength vs. L  Education / Equipment: HEP, therabands   Patient agrees to discharge. Patient  goals were partially met. Patient is being discharged due to being pleased with the current functional level.  Vanessa [place]edpatch, PT, DPT 09/07/20 5:29 PM

## 2020-09-07 NOTE — Patient Instructions (Signed)
Instructed pt to independently progress her HEP and to resume her previous workout regimen while monitoring symptom response to added exercise.

## 2020-09-21 ENCOUNTER — Encounter: Payer: Self-pay | Admitting: Emergency Medicine

## 2020-09-21 ENCOUNTER — Other Ambulatory Visit: Payer: Self-pay

## 2020-09-21 ENCOUNTER — Ambulatory Visit
Admission: EM | Admit: 2020-09-21 | Discharge: 2020-09-21 | Disposition: A | Discharge status: Home / Self Care | Payer: 59 | Attending: Internal Medicine | Admitting: Internal Medicine

## 2020-09-21 DIAGNOSIS — J069 Acute upper respiratory infection, unspecified: Secondary | ICD-10-CM

## 2020-09-21 DIAGNOSIS — Z20822 Contact with and (suspected) exposure to covid-19: Secondary | ICD-10-CM | POA: Diagnosis not present

## 2020-09-21 MED ORDER — ALBUTEROL SULFATE HFA 108 (90 BASE) MCG/ACT IN AERS: 1.0000 | INHALATION_SPRAY | Freq: Four times a day (QID) | RESPIRATORY_TRACT | 0 refills | Status: DC | PRN

## 2020-09-21 MED ORDER — PREDNISONE 20 MG PO TABS: 40.0000 mg | ORAL_TABLET | Freq: Every day | ORAL | 0 refills | Status: AC

## 2020-09-21 NOTE — ED Triage Notes (Signed)
Boyfriend tested positive for Covid on 9/3. Pt now experiencing scratchy throat, dry cough, chest tightness with cough. Denies fever, headache

## 2020-09-21 NOTE — Discharge Instructions (Addendum)
Your COVID-19 test is pending.  We will call if it is positive.  You have been prescribed albuterol inhaler and prednisone steroid to help alleviate your symptoms.  Please go to the hospital if shortness of breath develops.

## 2020-09-21 NOTE — ED Provider Notes (Signed)
[facility]    CSN: [id number] Arrival date & time: 09/21/20  0808      History   Chief Complaint Chief Complaint  Patient presents with   Cough    HPI Kristen White is a 50 y.o. female.   Patient presents with 2-day history of scratchy throat, dry cough, mild nasal congestion.  Cough is nonproductive per patient, but she does have some chest tightness that occurs only with coughing.  Denies any known fevers.  Boyfriend has tested positive for COVID-19 but negative for strep throat multiple times.  Denies any chest pain or shortness of breath.  Patient has not yet taken any over-the-counter medications to help alleviate symptoms other than throat lozenges.   Cough  Past Medical History:  Diagnosis Date   Allergy    Family history of colonic polyps    GERD (gastroesophageal reflux disease)    Heart murmur     Patient Active Problem List   Diagnosis Date Noted   Lateral epicondylitis of right elbow 08/17/2020   Back spasm 01/31/2020   Sensorineural hearing loss (SNHL) of right ear 04/07/2017   Seasonal allergies 04/06/2017   Acid reflux 04/06/2017    Past Surgical History:  Procedure Laterality Date   IMPLANTATION BONE ANCHORED HEARING AID Right 10/2019   INNER EAR SURGERY Right    TENOLYSIS FOREARM / WRIST     WISDOM TOOTH EXTRACTION      OB History   No obstetric history on file.      Home Medications    Prior to Admission medications   Medication Sig Start Date End Date Taking? Authorizing Provider  albuterol (VENTOLIN HFA) 108 (90 Base) MCG/ACT inhaler Inhale 1-2 puffs into the lungs every 6 (six) hours as needed for wheezing or shortness of breath. 09/21/20  Yes Lance Muss, FNP  predniSONE (DELTASONE) 20 MG tablet Take 2 tablets (40 mg total) by mouth daily for 5 days. 09/21/20 09/26/20 Yes Lance Muss, FNP  Ascorbic Acid (VITAMIN C) 100 MG tablet Take 100 mg by mouth daily.    [provider]  cetirizine (ZYRTEC) 10 MG  tablet Take 10 mg by mouth daily.    [provider]  Cholecalciferol (D3-1000 PO) Take 1 each by mouth daily.    [provider]  ibuprofen (ADVIL) 400 MG tablet Take 400 mg by mouth every 6 (six) hours as needed. As needed    [provider]  medroxyPROGESTERone Acetate 150 MG/ML SUSY INJECT 1 ML INTO THE MUSCLE ONCE FOR 1 DOSE. INJECT 1 ML INTO THE MUSCLE EVERY 3 MONTHS. 05/25/20   Arvilla Market, MD  methocarbamol (ROBAXIN) 500 MG tablet Take 1 tablet (500 mg total) by mouth every 8 (eight) hours as needed for muscle spasms. 01/30/20   Mayers, Cari S, PA-C  Multiple Vitamin (MULTIVITAMIN) tablet Take 1 tablet by mouth daily.    [provider]  mupirocin ointment (BACTROBAN) 2 % SMARTSIG:1 Application Topical 2-3 Times Daily Patient not taking: Reported on 08/14/2020 12/27/19   [provider]    Family History Family History  Problem Relation Age of Onset   Early death Mother    Hypertension Father    Colon polyps Father    Alcohol abuse Maternal Uncle    Alcohol abuse Paternal Uncle    Colon polyps Paternal Uncle    Colon cancer Neg Hx    Stomach cancer Neg Hx    Rectal cancer Neg Hx    Esophageal cancer Neg  Hx     Social History Social History   Tobacco Use   Smoking status: Never   Smokeless tobacco: Never  Vaping Use   Vaping Use: Never used  Substance Use Topics   Alcohol use: Yes    Comment: rare   Drug use: Never     Allergies   Other, No known allergies, and Metronidazole   Review of Systems Review of Systems Per HPI  Physical Exam Triage Vital Signs ED Triage Vitals  Enc Vitals Group     BP 09/21/20 0817 123/84     Pulse Rate 09/21/20 0817 (!) 107     Resp 09/21/20 0817 16     Temp 09/21/20 0817 98.2 F (36.8 C)     Temp Source 09/21/20 0817 Oral     SpO2 09/21/20 0817 98 %     Weight --      Height --      Head Circumference --      Peak Flow --      Pain Score 09/21/20 0819 0     Pain  Loc --      Pain Edu? --      Excl. in GC? --    No data found.  Updated Vital Signs BP 123/84 (BP Location: Left Arm)   Pulse (!) 107   Temp 98.2 F (36.8 C) (Oral)   Resp 16   SpO2 98%   Visual Acuity Right Eye Distance:   Left Eye Distance:   Bilateral Distance:    Right Eye Near:   Left Eye Near:    Bilateral Near:     Physical Exam Constitutional:      General: She is not in acute distress.    Appearance: Normal appearance.  HENT:     Head: Normocephalic and atraumatic.     Right Ear: Tympanic membrane and ear canal normal.     Left Ear: Tympanic membrane and ear canal normal.     Nose: Congestion present.     Mouth/Throat:     Mouth: Mucous membranes are moist.     Pharynx: No posterior oropharyngeal erythema.  Eyes:     Extraocular Movements: Extraocular movements intact.     Conjunctiva/sclera: Conjunctivae normal.     Pupils: Pupils are equal, round, and reactive to light.  Cardiovascular:     Rate and Rhythm: Normal rate and regular rhythm.     Pulses: Normal pulses.     Heart sounds: Normal heart sounds.  Pulmonary:     Effort: Pulmonary effort is normal. No respiratory distress.     Breath sounds: Normal breath sounds. No wheezing.  Abdominal:     General: Abdomen is flat. Bowel sounds are normal.     Palpations: Abdomen is soft.  Musculoskeletal:        General: Normal range of motion.     Cervical back: Normal range of motion.  Skin:    General: Skin is warm and dry.  Neurological:     General: No focal deficit present.     Mental Status: She is alert and oriented to person, place, and time. Mental status is at baseline.  Psychiatric:        Mood and Affect: Mood normal.        Behavior: Behavior normal.     UC Treatments / Results  Labs (all labs ordered are listed, but only abnormal results are displayed) Labs Reviewed  NOVEL CORONAVIRUS, NAA    EKG   Radiology No results found.  Procedures Procedures (including critical  care time)  Medications Ordered in UC Medications - No data to display  Initial Impression / Assessment and Plan / UC Course  I have reviewed the triage vital signs and the nursing notes.  Pertinent labs & imaging results that were available during my care of the patient were reviewed by me and considered in my medical decision making (see chart for details).     Patient presents with symptoms likely from a viral upper respiratory infection. Differential includes bacterial pneumonia, sinusitis, allergic rhinitis, Covid 19. Do not suspect underlying cardiopulmonary process. Symptoms seem unlikely related to ACS, CHF or COPD exacerbations, pneumonia, pneumothorax. Patient is nontoxic appearing and not in need of emergent medical intervention.  Highly suspect COVID-19 due to close exposure.  Discussed antiviral medications with patient.  Patient declined antiviral medication at this time.  Will prescribe prednisone steroid to help decrease chest tightness as well as albuterol inhaler to help with this.  COVID-19 PCR is pending.  Do not think that strep test is needed at this time.  Recommended symptom control with over the counter medications: Daily oral anti-histamine, Oral decongestant or IN corticosteroid, saline irrigations, cepacol lozenges, Robitussin, Delsym, honey tea.  Return if symptoms fail to improve in 1-2 weeks or you develop shortness of breath, chest pain, severe headache. Patient states understanding and is agreeable.  Discharged with PCP followup.  Final Clinical Impressions(s) / UC Diagnoses   Final diagnoses:  Encounter for screening laboratory testing for COVID-19 virus  Viral upper respiratory tract infection with cough  Close exposure to COVID-19 virus     Discharge Instructions      Your COVID-19 test is pending.  We will call if it is positive.  You have been prescribed albuterol inhaler and prednisone steroid to help alleviate your symptoms.  Please go to the  hospital if shortness of breath develops.     ED Prescriptions     Medication Sig Dispense Auth. Provider   predniSONE (DELTASONE) 20 MG tablet Take 2 tablets (40 mg total) by mouth daily for 5 days. 10 tablet Lance Muss, FNP   albuterol (VENTOLIN HFA) 108 (90 Base) MCG/ACT inhaler Inhale 1-2 puffs into the lungs every 6 (six) hours as needed for wheezing or shortness of breath. 1 each Lance Muss, FNP      PDMP not reviewed this encounter.   Lance Muss, FNP 09/21/20 [phone]

## 2020-09-23 LAB — SARS-COV-2, NAA 2 DAY TAT

## 2020-09-23 LAB — NOVEL CORONAVIRUS, NAA: SARS-CoV-2, NAA: NOT DETECTED

## 2020-11-19 ENCOUNTER — Ambulatory Visit: Payer: 59 | Attending: Internal Medicine

## 2020-11-19 ENCOUNTER — Other Ambulatory Visit (HOSPITAL_BASED_OUTPATIENT_CLINIC_OR_DEPARTMENT_OTHER): Payer: Self-pay

## 2020-11-19 DIAGNOSIS — Z23 Encounter for immunization: Secondary | ICD-10-CM

## 2020-11-19 MED ORDER — PFIZER COVID-19 VAC BIVALENT 30 MCG/0.3ML IM SUSP
INTRAMUSCULAR | 0 refills | Status: DC
  Filled 2020-11-19: qty 0.3, 1d supply, fill #0

## 2020-11-19 NOTE — Progress Notes (Signed)
   Covid-19 Vaccination Clinic  Name:  Kristen White    MRN: [id number] DOB: 1970-04-13  11/19/2020  [name] was observed post Covid-19 immunization for 15 minutes without incident. She was provided with Vaccine Information Sheet and instruction to access the V-Safe system.   [name] was instructed to call 911 with any severe reactions post vaccine: Difficulty breathing  Swelling of face and throat  A fast heartbeat  A bad rash all over body  Dizziness and weakness   Immunizations Administered     Name Date Dose VIS Date Route   Pfizer Covid-19 Vaccine Bivalent Booster 11/19/2020 11:06 AM 0.3 mL 09/16/2020 Intramuscular   Manufacturer: [employer], [employer]   Lot: [id number]   NDC: [id number]

## 2020-11-20 ENCOUNTER — Ambulatory Visit: Payer: 59 | Admitting: Family Medicine

## 2020-11-20 ENCOUNTER — Other Ambulatory Visit: Payer: Self-pay

## 2020-11-20 ENCOUNTER — Encounter: Payer: Self-pay | Admitting: Family Medicine

## 2020-11-20 VITALS — BP 146/94 | HR 103 | Temp 97.0°F | Resp 16 | Wt 201.0 lb

## 2020-11-20 DIAGNOSIS — R030 Elevated blood-pressure reading, without diagnosis of hypertension: Secondary | ICD-10-CM

## 2020-11-20 DIAGNOSIS — M62830 Muscle spasm of back: Secondary | ICD-10-CM

## 2020-11-20 DIAGNOSIS — Z6833 Body mass index (BMI) 33.0-33.9, adult: Secondary | ICD-10-CM | POA: Diagnosis not present

## 2020-11-20 DIAGNOSIS — E6609 Other obesity due to excess calories: Secondary | ICD-10-CM

## 2020-11-20 MED ORDER — METHOCARBAMOL 500 MG PO TABS
500.0000 mg | ORAL_TABLET | Freq: Three times a day (TID) | ORAL | 0 refills | Status: AC | PRN
Start: 2020-11-20 — End: ?

## 2020-11-20 NOTE — Progress Notes (Signed)
Established Patient Office Visit  Subjective:  Patient ID: Kristen White, female    DOB: [birth year]  Age: 50 y.o. MRN: [id number]  CC:  Chief Complaint  Patient presents with   Follow-up    HPI Kristen White presents for Complaint: Desiring to lose weight.  Patient also reports intermittent back spasms for which she would like refill of Robaxin.  Past Medical History:  Diagnosis Date   Allergy    Family history of colonic polyps    GERD (gastroesophageal reflux disease)    Heart murmur     Past Surgical History:  Procedure Laterality Date   IMPLANTATION BONE ANCHORED HEARING AID Right 10/2019   INNER EAR SURGERY Right    TENOLYSIS FOREARM / WRIST     WISDOM TOOTH EXTRACTION      Family History  Problem Relation Age of Onset   Early death Mother    Hypertension Father    Colon polyps Father    Alcohol abuse Maternal Uncle    Alcohol abuse Paternal Uncle    Colon polyps Paternal Uncle    Colon cancer Neg Hx    Stomach cancer Neg Hx    Rectal cancer Neg Hx    Esophageal cancer Neg Hx     Social History   Socioeconomic History   Marital status: Single    Spouse name: Not on file   Number of children: Not on file   Years of education: Not on file   Highest education level: Not on file  Occupational History   Occupation: Leisure centre manager  Tobacco Use   Smoking status: Never   Smokeless tobacco: Never  Vaping Use   Vaping Use: Never used  Substance and Sexual Activity   Alcohol use: Yes    Comment: rare   Drug use: Never   Sexual activity: Not Currently    Birth control/protection: None  Other Topics Concern   Not on file  Social History Narrative   Not on file   Social Determinants of Health   Financial Resource Strain: Not on file  Food Insecurity: Not on file  Transportation Needs: Not on file  Physical Activity: Not on file  Stress: Not on file  Social Connections: Not on file  Intimate Partner Violence: Not on file    ROS Review of  Systems  All other systems reviewed and are negative.  Objective:   Today's Vitals: BP (!) 146/94   Pulse (!) 103   Temp (!) 97 F (36.1 C) (Temporal)   Resp 16   Wt 201 lb (91.2 kg)   BMI 33.45 kg/m   Physical Exam Vitals and nursing note reviewed.  Constitutional:      General: She is not in acute distress.    Appearance: She is obese.  Cardiovascular:     Rate and Rhythm: Normal rate and regular rhythm.  Pulmonary:     Effort: Pulmonary effort is normal.     Breath sounds: Normal breath sounds.  Abdominal:     Palpations: Abdomen is soft.     Tenderness: There is no abdominal tenderness.  Musculoskeletal:     Right lower leg: No edema.     Left lower leg: No edema.  Neurological:     General: No focal deficit present.     Mental Status: She is alert and oriented to person, place, and time.    Assessment & Plan:   1. Back spasm Robaxin was refilled for as needed use. - methocarbamol (ROBAXIN) 500 MG  tablet; Take 1 tablet (500 mg total) by mouth every 8 (eight) hours as needed for muscle spasms.  Dispense: 30 tablet; Refill: 0  2. Class 1 obesity due to excess calories without serious comorbidity with body mass index (BMI) of 33.0 to 33.9 in adult Discussed dietary and activity options.  Goal for patient is 2 to 4 pounds weight loss per month.  3. Elevated blood pressure reading without diagnosis of hypertension Elevated blood pressure noted.  Will monitor.  Consider starting agent if persist.    Outpatient Encounter Medications as of 11/20/2020  Medication Sig   Ascorbic Acid (VITAMIN C) 100 MG tablet Take 100 mg by mouth daily.   cetirizine (ZYRTEC) 10 MG tablet Take 10 mg by mouth daily.   Cholecalciferol (D3-1000 PO) Take 1 each by mouth daily.   COVID-19 mRNA bivalent vaccine, Pfizer, (PFIZER COVID-19 VAC BIVALENT) injection Inject into the muscle.   ibuprofen (ADVIL) 400 MG tablet Take 400 mg by mouth every 6 (six) hours as needed. As needed    medroxyPROGESTERone Acetate 150 MG/ML SUSY INJECT 1 ML INTO THE MUSCLE ONCE FOR 1 DOSE. INJECT 1 ML INTO THE MUSCLE EVERY 3 MONTHS.   Multiple Vitamin (MULTIVITAMIN) tablet Take 1 tablet by mouth daily.   [DISCONTINUED] methocarbamol (ROBAXIN) 500 MG tablet Take 1 tablet (500 mg total) by mouth every 8 (eight) hours as needed for muscle spasms.   methocarbamol (ROBAXIN) 500 MG tablet Take 1 tablet (500 mg total) by mouth every 8 (eight) hours as needed for muscle spasms.   [DISCONTINUED] albuterol (VENTOLIN HFA) 108 (90 Base) MCG/ACT inhaler Inhale 1-2 puffs into the lungs every 6 (six) hours as needed for wheezing or shortness of breath.   [DISCONTINUED] mupirocin ointment (BACTROBAN) 2 % SMARTSIG:1 Application Topical 2-3 Times Daily (Patient not taking: Reported on 08/14/2020)   No facility-administered encounter medications on file as of 11/20/2020.    Follow-up: Return in about 6 months (around 05/20/2021) for follow up.   Tommie Raymond, MD

## 2020-11-20 NOTE — Progress Notes (Signed)
Patient need to talk about her medication and weight loss. Patient has no other concern today for provider.

## 2021-01-21 ENCOUNTER — Other Ambulatory Visit: Payer: Self-pay

## 2021-01-21 ENCOUNTER — Ambulatory Visit (INDEPENDENT_AMBULATORY_CARE_PROVIDER_SITE_OTHER): Payer: 59 | Admitting: Family Medicine

## 2021-01-21 ENCOUNTER — Encounter: Payer: Self-pay | Admitting: Family Medicine

## 2021-01-21 VITALS — BP 113/79 | HR 93 | Temp 98.6°F | Resp 16 | Wt 205.8 lb

## 2021-01-21 DIAGNOSIS — Z0000 Encounter for general adult medical examination without abnormal findings: Secondary | ICD-10-CM

## 2021-01-21 DIAGNOSIS — Z130 Encounter for screening for diseases of the blood and blood-forming organs and certain disorders involving the immune mechanism: Secondary | ICD-10-CM

## 2021-01-21 DIAGNOSIS — Z1329 Encounter for screening for other suspected endocrine disorder: Secondary | ICD-10-CM | POA: Diagnosis not present

## 2021-01-21 DIAGNOSIS — Z13228 Encounter for screening for other metabolic disorders: Secondary | ICD-10-CM | POA: Diagnosis not present

## 2021-01-21 NOTE — Progress Notes (Signed)
Established Patient Office Visit  Subjective:  Patient ID: Kristen White, female    DOB: 05-30-1970  Age: 51 y.o. MRN: [id number]  CC:  Chief Complaint  Patient presents with   Annual Exam    HPI Kristen White presents for routine annual exam. Patient denies acute complaints or concerns.   Past Medical History:  Diagnosis Date   Allergy    Family history of colonic polyps    GERD (gastroesophageal reflux disease)    Heart murmur     Past Surgical History:  Procedure Laterality Date   IMPLANTATION BONE ANCHORED HEARING AID Right 10/2019   INNER EAR SURGERY Right    TENOLYSIS FOREARM / WRIST     WISDOM TOOTH EXTRACTION      Family History  Problem Relation Age of Onset   Early death Mother    Hypertension Father    Colon polyps Father    Alcohol abuse Maternal Uncle    Alcohol abuse Paternal Uncle    Colon polyps Paternal Uncle    Colon cancer Neg Hx    Stomach cancer Neg Hx    Rectal cancer Neg Hx    Esophageal cancer Neg Hx     Social History   Socioeconomic History   Marital status: Single    Spouse name: Not on file   Number of children: Not on file   Years of education: Not on file   Highest education level: Not on file  Occupational History   Occupation: Transport planner  Tobacco Use   Smoking status: Never   Smokeless tobacco: Never  Vaping Use   Vaping Use: Never used  Substance and Sexual Activity   Alcohol use: Yes    Comment: rare   Drug use: Never   Sexual activity: Not Currently    Birth control/protection: None  Other Topics Concern   Not on file  Social History Narrative   Not on file   Social Determinants of Health   Financial Resource Strain: Not on file  Food Insecurity: Not on file  Transportation Needs: Not on file  Physical Activity: Not on file  Stress: Not on file  Social Connections: Not on file  Intimate Partner Violence: Not on file    ROS Review of Systems  All other systems reviewed and are  negative.  Objective:   Today's Vitals: BP 113/79    Pulse 93    Temp 98.6 F (37 C) (Oral)    Resp 16    Wt 205 lb 12.8 oz (93.4 kg)    SpO2 98%    BMI 34.25 kg/m   Physical Exam Vitals and nursing note reviewed.  Constitutional:      General: She is not in acute distress.    Appearance: She is obese.  HENT:     Head: Normocephalic and atraumatic.     Right Ear: Tympanic membrane, ear canal and external ear normal.     Left Ear: Tympanic membrane, ear canal and external ear normal.     Nose: Nose normal.     Mouth/Throat:     Mouth: Mucous membranes are moist.     Pharynx: Oropharynx is clear.  Eyes:     Conjunctiva/sclera: Conjunctivae normal.     Pupils: Pupils are equal, round, and reactive to light.  Neck:     Thyroid: No thyromegaly.  Cardiovascular:     Rate and Rhythm: Normal rate and regular rhythm.     Heart sounds: Normal heart sounds. No murmur heard.  Pulmonary:     Effort: Pulmonary effort is normal. No respiratory distress.     Breath sounds: Normal breath sounds.  Abdominal:     General: There is no distension.     Palpations: Abdomen is soft. There is no mass.     Tenderness: There is no abdominal tenderness.  Musculoskeletal:        General: Normal range of motion.     Cervical back: Normal range of motion and neck supple.  Skin:    General: Skin is warm and dry.  Neurological:     General: No focal deficit present.     Mental Status: She is alert and oriented to person, place, and time.  Psychiatric:        Mood and Affect: Mood normal.        Behavior: Behavior normal.    Assessment & Plan:   1. Well woman exam (no gynecological exam) Routine labs ordered - CMP14+EGFR - CBC with Differential - Lipid Panel  2. Screening for endocrine/metabolic/immunity disorders  - TSH - Hemoglobin A1c    Outpatient Encounter Medications as of 01/21/2021  Medication Sig   Ascorbic Acid (VITAMIN C) 100 MG tablet Take 100 mg by mouth daily.   cetirizine  (ZYRTEC) 10 MG tablet Take 10 mg by mouth daily.   Cholecalciferol (D3-1000 PO) Take 1 each by mouth daily.   COVID-19 mRNA bivalent vaccine, Pfizer, (PFIZER COVID-19 VAC BIVALENT) injection Inject into the muscle.   ibuprofen (ADVIL) 400 MG tablet Take 400 mg by mouth every 6 (six) hours as needed. As needed   medroxyPROGESTERone Acetate 150 MG/ML SUSY INJECT 1 ML INTO THE MUSCLE ONCE FOR 1 DOSE. INJECT 1 ML INTO THE MUSCLE EVERY 3 MONTHS.   methocarbamol (ROBAXIN) 500 MG tablet Take 1 tablet (500 mg total) by mouth every 8 (eight) hours as needed for muscle spasms.   Multiple Vitamin (MULTIVITAMIN) tablet Take 1 tablet by mouth daily.   No facility-administered encounter medications on file as of 01/21/2021.    Follow-up: No follow-ups on file.   Becky Sax, MD

## 2021-01-22 LAB — CMP14+EGFR
ALT: 17 IU/L (ref 0–32)
AST: 21 IU/L (ref 0–40)
Albumin/Globulin Ratio: 1.5 (ref 1.2–2.2)
Albumin: 4.6 g/dL (ref 3.8–4.8)
Alkaline Phosphatase: 72 IU/L (ref 44–121)
BUN/Creatinine Ratio: 23 (ref 9–23)
BUN: 23 mg/dL (ref 6–24)
Bilirubin Total: 0.3 mg/dL (ref 0.0–1.2)
CO2: 20 mmol/L (ref 20–29)
Calcium: 9.7 mg/dL (ref 8.7–10.2)
Chloride: 102 mmol/L (ref 96–106)
Creatinine, Ser: 0.99 mg/dL (ref 0.57–1.00)
Globulin, Total: 3 g/dL (ref 1.5–4.5)
Glucose: 77 mg/dL (ref 70–99)
Potassium: 4.2 mmol/L (ref 3.5–5.2)
Sodium: 138 mmol/L (ref 134–144)
Total Protein: 7.6 g/dL (ref 6.0–8.5)
eGFR: 70 mL/min/{1.73_m2} (ref >59)

## 2021-01-22 LAB — LIPID PANEL
Chol/HDL Ratio: 3.6 ratio (ref 0.0–4.4)
Cholesterol, Total: 216 mg/dL — ABNORMAL HIGH (ref 100–199)
HDL: 60 mg/dL (ref >39)
LDL Chol Calc (NIH): 143 mg/dL — ABNORMAL HIGH (ref 0–99)
Triglycerides: 76 mg/dL (ref 0–149)
VLDL Cholesterol Cal: 13 mg/dL (ref 5–40)

## 2021-01-22 LAB — CBC WITH DIFFERENTIAL/PLATELET
Basophils Absolute: 0.1 10*3/uL (ref 0.0–0.2)
Basos: 1 %
EOS (ABSOLUTE): 0.3 10*3/uL (ref 0.0–0.4)
Eos: 4 %
Hematocrit: 44.4 % (ref 34.0–46.6)
Hemoglobin: 14.5 g/dL (ref 11.1–15.9)
Immature Grans (Abs): 0 10*3/uL (ref 0.0–0.1)
Immature Granulocytes: 0 %
Lymphocytes Absolute: 3.7 10*3/uL — ABNORMAL HIGH (ref 0.7–3.1)
Lymphs: 46 %
MCH: 27.7 pg (ref 26.6–33.0)
MCHC: 32.7 g/dL (ref 31.5–35.7)
MCV: 85 fL (ref 79–97)
Monocytes Absolute: 0.5 10*3/uL (ref 0.1–0.9)
Monocytes: 7 %
Neutrophils Absolute: 3.2 10*3/uL (ref 1.4–7.0)
Neutrophils: 42 %
Platelets: 315 10*3/uL (ref 150–450)
RBC: 5.23 x10E6/uL (ref 3.77–5.28)
RDW: 13.1 % (ref 11.7–15.4)
WBC: 7.8 10*3/uL (ref 3.4–10.8)

## 2021-01-22 LAB — TSH: TSH: 3.19 u[IU]/mL (ref 0.450–4.500)

## 2021-01-22 LAB — HEMOGLOBIN A1C

## 2021-02-06 ENCOUNTER — Emergency Department (HOSPITAL_BASED_OUTPATIENT_CLINIC_OR_DEPARTMENT_OTHER)
Admission: EM | Admit: 2021-02-06 | Discharge: 2021-02-06 | Disposition: A | Discharge status: Home / Self Care | Payer: 59 | Attending: Emergency Medicine | Admitting: Emergency Medicine

## 2021-02-06 ENCOUNTER — Encounter (HOSPITAL_BASED_OUTPATIENT_CLINIC_OR_DEPARTMENT_OTHER): Payer: Self-pay

## 2021-02-06 ENCOUNTER — Other Ambulatory Visit: Payer: Self-pay

## 2021-02-06 DIAGNOSIS — M25531 Pain in right wrist: Secondary | ICD-10-CM | POA: Diagnosis present

## 2021-02-06 DIAGNOSIS — M654 Radial styloid tenosynovitis [de Quervain]: Secondary | ICD-10-CM | POA: Insufficient documentation

## 2021-02-06 MED ORDER — IBUPROFEN 800 MG PO TABS: 800.0000 mg | ORAL_TABLET | Freq: Three times a day (TID) | ORAL | 0 refills | Status: AC

## 2021-02-06 NOTE — Discharge Instructions (Addendum)
You were evaluated in the Emergency Department and after careful evaluation, we did not find any emergent condition requiring admission or further testing in the hospital.  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  We will manage this with a thumb and wrist splint.  You can remove this once a day to perform range of motion exercises.  Recommend NSAIDs for 10 to 14 days.  NSAIDs and splinting is considered conservative management of this.  Recommend follow-up with your PCP as persistent cases may require steroid injection or consideration for surgical decompression if symptoms persist.  Please return to the Emergency Department if you experience any worsening of your condition.  Thank you for allowing [place] to be a part of your care.

## 2021-02-06 NOTE — ED Notes (Signed)
Dc instructions reviewed with patient. Patient voiced understanding. Dc with belongings.  °

## 2021-02-06 NOTE — ED Provider Notes (Signed)
[facility] EMERGENCY DEPT Provider Note   CSN: [id number] Arrival date & time: 02/06/21  0856     History  Chief Complaint  Patient presents with   Wrist Pain    Right    Kristen White is a 51 y.o. female.   Wrist Pain   51 year old female who presents to the emergency department with a chief complaint of right wrist and forearm pain.  She states that symptoms began on Monday that she first noticed when waking.  She endorses a sharp pain with range of motion of her thumb.  She has been exercising and working out more frequently recently.  She endorses pain along the radial aspect of the wrist which radiates and extends into the forearm.  She endorses pain with abduction of the thumb and abduction of the thumb.  She also endorses some decreased grip strength.  She denies any traumatic injury.  Home Medications Prior to Admission medications   Medication Sig Start Date End Date Taking? Authorizing Provider  cetirizine (ZYRTEC) 10 MG tablet Take 10 mg by mouth daily.   Yes [provider]  Cholecalciferol (D3-1000 PO) Take 1 each by mouth daily.   Yes [provider]  Ascorbic Acid (VITAMIN C) 100 MG tablet Take 100 mg by mouth daily. Patient not taking: Reported on 02/06/2021    [provider]  COVID-19 mRNA bivalent vaccine, Pfizer, (PFIZER COVID-19 VAC BIVALENT) injection Inject into the muscle. Patient not taking: Reported on 02/06/2021 11/19/20   Judyann Munson, MD  ibuprofen (ADVIL) 400 MG tablet Take 400 mg by mouth every 6 (six) hours as needed. As needed Patient not taking: Reported on 02/06/2021    [provider]  medroxyPROGESTERone Acetate 150 MG/ML SUSY INJECT 1 ML INTO THE MUSCLE ONCE FOR 1 DOSE. INJECT 1 ML INTO THE MUSCLE EVERY 3 MONTHS. 05/25/20   Arvilla Market, MD  methocarbamol (ROBAXIN) 500 MG tablet Take 1 tablet (500 mg total) by mouth every 8 (eight) hours as needed for muscle spasms. 11/20/20    Georganna Skeans, MD  Multiple Vitamin (MULTIVITAMIN) tablet Take 1 tablet by mouth daily. Patient not taking: Reported on 02/06/2021    [provider]      Allergies    Other, No known allergies, and Metronidazole    Review of Systems   Review of Systems  Physical Exam Updated Vital Signs BP (!) 134/99 (BP Location: Left Arm)    Pulse 90    Temp 98.3 F (36.8 C) (Oral)    Resp 16    Ht 5\' 5"  (1.651 m)    Wt 91.2 kg    SpO2 100%    BMI 33.45 kg/m  Physical Exam Vitals and nursing note reviewed.  Constitutional:      General: She is not in acute distress. HENT:     Head: Normocephalic and atraumatic.  Eyes:     Conjunctiva/sclera: Conjunctivae normal.     Pupils: Pupils are equal, round, and reactive to light.  Cardiovascular:     Rate and Rhythm: Normal rate and regular rhythm.  Pulmonary:     Effort: Pulmonary effort is normal. No respiratory distress.  Abdominal:     General: There is no distension.     Tenderness: There is no guarding.  Musculoskeletal:        General: Tenderness present. No deformity or signs of injury. Normal range of motion.     Cervical back: Neck supple.     Comments: Tenderness to palpation about  the adductor pollicis longus with positive Eickhoff and Finkelstein's test.  Negative Phalen and Tinel test.  No tenderness of the distal radius or ulna with range of motion intact  Skin:    Findings: No lesion or rash.  Neurological:     General: No focal deficit present.     Mental Status: She is alert. Mental status is at baseline.     Comments: 4-5 grip strength of the hand on the right, 5 out of 5 grip strength of the hand on the left    ED Results / Procedures / Treatments   Labs (all labs ordered are listed, but only abnormal results are displayed) Labs Reviewed - No data to display  EKG None  Radiology No results found.  Procedures Procedures    Medications Ordered in ED Medications - No data to display  ED Course/ Medical  Decision Making/ A&P                           Medical Decision Making  51 year old female who presents to the emergency department with a chief complaint of right wrist and forearm pain.  She states that symptoms began on Monday that she first noticed when waking.  She endorses a sharp pain with range of motion of her thumb.  She has been exercising and working out more frequently recently.  She endorses pain along the radial aspect of the wrist which radiates and extends into the forearm.  She endorses pain with abduction of the thumb and abduction of the thumb.  She also endorses some decreased grip strength.  She denies any traumatic injury.  Physical exam on arrival significant for slightly decreased grip strength on the right and tenderness to palpation about the adductor pollicis longus with positive Eickhoff and Finkelstein's test.  Negative Phalen and Tinel test.  No tenderness of the distal radius or ulna.  Signs and symptoms are concerning for de Quervain's tenosynovitis.  Low concern for septic arthritis at this time.  No evidence for carpal tunnel syndrome.  No evidence for cellulitis.  Low concern for acute fracture.  We will treat with a splint of the thumb and wrist, NSAIDs and PCP follow-up.  Final Clinical Impression(s) / ED Diagnoses Final diagnoses:  De Quervain's tenosynovitis, right    Rx / DC Orders ED Discharge Orders     None         Ernie Avena, MD 02/06/21 [phone]

## 2021-02-06 NOTE — ED Triage Notes (Signed)
Pt arrives POV with c/o right wrist and forearm pain beginning Monday morning upon waking. Describes as sharp pain. Denies fall or injury.  Has been exercising/working out more recently.  Feels grip strength is weak.

## 2021-03-04 ENCOUNTER — Encounter: Payer: Self-pay | Admitting: Family Medicine

## 2021-03-04 ENCOUNTER — Other Ambulatory Visit: Payer: Self-pay

## 2021-03-04 ENCOUNTER — Ambulatory Visit: Payer: 59 | Admitting: Family Medicine

## 2021-03-04 VITALS — BP 117/80 | HR 101 | Temp 98.6°F | Resp 16 | Wt 200.0 lb

## 2021-03-04 DIAGNOSIS — M654 Radial styloid tenosynovitis [de Quervain]: Secondary | ICD-10-CM

## 2021-03-04 NOTE — Progress Notes (Signed)
New Patient Office Visit  Subjective:  Patient ID: Kristen White, female    DOB: [birth year]  Age: 51 y.o. MRN: [id number]  CC:  Chief Complaint  Patient presents with   Wrist Pain    HPI Kristen White presents for follow up of ED visit where she was dx with Suzette Battiest of her right wrist. She reports worsening sx with difficulty doing ADLs and performing her duties at work.   Past Medical History:  Diagnosis Date   Allergy    Family history of colonic polyps    GERD (gastroesophageal reflux disease)    Heart murmur     Past Surgical History:  Procedure Laterality Date   IMPLANTATION BONE ANCHORED HEARING AID Right 10/2019   INNER EAR SURGERY Right    TENOLYSIS FOREARM / WRIST     WISDOM TOOTH EXTRACTION      Family History  Problem Relation Age of Onset   Early death Mother    Hypertension Father    Colon polyps Father    Alcohol abuse Maternal Uncle    Alcohol abuse Paternal Uncle    Colon polyps Paternal Uncle    Colon cancer Neg Hx    Stomach cancer Neg Hx    Rectal cancer Neg Hx    Esophageal cancer Neg Hx     Social History   Socioeconomic History   Marital status: Single    Spouse name: Not on file   Number of children: Not on file   Years of education: Not on file   Highest education level: Not on file  Occupational History   Occupation: Leisure centre manager  Tobacco Use   Smoking status: Never   Smokeless tobacco: Never  Vaping Use   Vaping Use: Never used  Substance and Sexual Activity   Alcohol use: Yes    Comment: rare   Drug use: Never   Sexual activity: Not Currently    Birth control/protection: None  Other Topics Concern   Not on file  Social History Narrative   Not on file   Social Determinants of Health   Financial Resource Strain: Not on file  Food Insecurity: Not on file  Transportation Needs: Not on file  Physical Activity: Not on file  Stress: Not on file  Social Connections: Not on file  Intimate Partner Violence:  Not on file    ROS Review of Systems  All other systems reviewed and are negative.  Objective:   Today's Vitals: BP 117/80    Pulse (!) 101    Temp 98.6 F (37 C) (Oral)    Resp 16    Wt 200 lb (90.7 kg)    SpO2 98%    BMI 33.28 kg/m   Physical Exam Vitals and nursing note reviewed.  Constitutional:      General: She is not in acute distress. Cardiovascular:     Rate and Rhythm: Normal rate and regular rhythm.  Pulmonary:     Effort: Pulmonary effort is normal.     Breath sounds: Normal breath sounds.  Musculoskeletal:     Comments: Right wrist with splint  Neurological:     General: No focal deficit present.     Mental Status: She is alert and oriented to person, place, and time.    Assessment & Plan:   1. De Quervain's disease (tenosynovitis) Referral to ortho for further eval/mgt - Ambulatory referral to Orthopedic Surgery    Outpatient Encounter Medications as of 03/04/2021  Medication Sig   Ascorbic  Acid (VITAMIN C) 100 MG tablet Take 100 mg by mouth daily.   cetirizine (ZYRTEC) 10 MG tablet Take 10 mg by mouth daily.   Cholecalciferol (D3-1000 PO) Take 1 each by mouth daily.   COVID-19 mRNA bivalent vaccine, Pfizer, (PFIZER COVID-19 VAC BIVALENT) injection Inject into the muscle.   medroxyPROGESTERone Acetate 150 MG/ML SUSY INJECT 1 ML INTO THE MUSCLE ONCE FOR 1 DOSE. INJECT 1 ML INTO THE MUSCLE EVERY 3 MONTHS.   methocarbamol (ROBAXIN) 500 MG tablet Take 1 tablet (500 mg total) by mouth every 8 (eight) hours as needed for muscle spasms.   Multiple Vitamin (MULTIVITAMIN) tablet Take 1 tablet by mouth daily.   No facility-administered encounter medications on file as of 03/04/2021.    Follow-up: No follow-ups on file.   Tommie Raymond, MD

## 2021-03-04 NOTE — Progress Notes (Signed)
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 Patient is requesting referral for cortisone injection.

## 2021-03-05 ENCOUNTER — Ambulatory Visit: Payer: 59 | Admitting: Orthopedic Surgery

## 2021-03-05 DIAGNOSIS — M654 Radial styloid tenosynovitis [de Quervain]: Secondary | ICD-10-CM | POA: Insufficient documentation

## 2021-03-05 MED ORDER — LIDOCAINE HCL 1 % IJ SOLN
1.0000 mL | INTRAMUSCULAR | Status: AC | PRN
  Administered 2021-03-05: 1 mL

## 2021-03-05 MED ORDER — BETAMETHASONE SOD PHOS & ACET 6 (3-3) MG/ML IJ SUSP
6.0000 mg | INTRAMUSCULAR | Status: AC | PRN
  Administered 2021-03-05: 6 mg via INTRA_ARTICULAR

## 2021-03-05 NOTE — Progress Notes (Signed)
Office Visit Note   Patient: Kristen White           Date of Birth: Jul 31, 1970           MRN: [id number] Visit Date: 03/05/2021              Requested by: Dorna Mai, [address] [place],  [place] [address] PCP: Dorna Mai, MD   Assessment & Plan: Visit Diagnoses:  1. De Quervain's tenosynovitis, right     Plan: We discussed the diagnosis, prognosis, non-operative and operative treatment options for De Quervain's tenosynovitis.  After our discussion, the patient would like to proceed with corticosteroid injection.  We reviewed the risks and benefits of conservative management.  The patient expressed understanding of the reasoning and strategy going forward.  All patient questions and concerns were addressed.    Follow-Up Instructions: No follow-ups on file.   Orders:  No orders of the defined types were placed in this encounter.  No orders of the defined types were placed in this encounter.     Procedures: Hand/UE Inj: R extensor compartment 1 for de Quervain's tenosynovitis on 03/05/2021 4:17 PM Indications: tendon swelling and therapeutic Details: 25 G needle, radial approach Medications: 1 mL lidocaine 1 %; 6 mg betamethasone acetate-betamethasone sodium phosphate 6 (3-3) MG/ML Outcome: tolerated well, no immediate complications Procedure, treatment alternatives, risks and benefits explained, specific risks discussed. Consent was given by the patient. Immediately prior to procedure a time out was called to verify the correct patient, procedure, equipment, support staff and site/side marked as required. Patient was prepped and draped in the usual sterile fashion.      Clinical Data: No additional findings.   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  Is been going on since 02/06/2021.  Her pain  is localized to the area of the radial styloid.  It worse with range of motion of the thumb and ulnar deviation of the wrist.  She has tried wearing a brace with some symptom relief but the braces, cumbersome.  The pain is not worsening since first noticed but has not improved either.   Review of Systems   Objective: Vital Signs: There were no vitals taken for this visit.  Physical Exam Constitutional:      Appearance: Normal appearance.  Cardiovascular:     Rate and Rhythm: Normal rate.     Pulses: Normal pulses.  Pulmonary:     Effort: Pulmonary effort is normal.  Skin:    General: Skin is warm and dry.     Capillary Refill: Capillary refill takes less than 2 seconds.  Neurological:     Mental Status: She is alert.    Right Hand Exam   Tenderness  Right hand tenderness location: TTP at radial wrist over radial styloid.  Range of Motion  The patient has normal right wrist ROM.   Muscle Strength  The patient has normal right wrist strength.  Other  Erythema: absent Sensation: normal Pulse: present  Comments:  ++ Finkelstein test.  Negative CMC grind test.      Specialty Comments:  No specialty comments available.  Imaging: No results found.   PMFS History: Patient Active Problem List   Diagnosis Date Noted   De Quervain's tenosynovitis, right 03/05/2021   Lateral epicondylitis of right elbow 08/17/2020   Back spasm  01/31/2020   Sensorineural hearing loss (SNHL) of right ear 04/07/2017   Seasonal allergies 04/06/2017   Acid reflux 04/06/2017   Past Medical History:  Diagnosis Date   Allergy    Family history of colonic polyps    GERD (gastroesophageal reflux disease)    Heart murmur     Family History  Problem Relation Age of Onset   Early death Mother    Hypertension Father    Colon polyps Father    Alcohol abuse Maternal Uncle    Alcohol abuse Paternal Uncle    Colon polyps Paternal Uncle    Colon cancer Neg Hx    Stomach cancer Neg Hx     Rectal cancer Neg Hx    Esophageal cancer Neg Hx     Past Surgical History:  Procedure Laterality Date   IMPLANTATION BONE ANCHORED HEARING AID Right 10/2019   INNER EAR SURGERY Right    TENOLYSIS FOREARM / WRIST     WISDOM TOOTH EXTRACTION     Social History   Occupational History   Occupation: Transport planner  Tobacco Use   Smoking status: Never   Smokeless tobacco: Never  Vaping Use   Vaping Use: Never used  Substance and Sexual Activity   Alcohol use: Yes    Comment: rare   Drug use: Never   Sexual activity: Not Currently    Birth control/protection: None

## 2021-04-29 ENCOUNTER — Encounter: Payer: Self-pay | Admitting: Orthopedic Surgery

## 2021-04-29 ENCOUNTER — Ambulatory Visit: Payer: 59 | Admitting: Orthopedic Surgery

## 2021-04-29 DIAGNOSIS — M654 Radial styloid tenosynovitis [de Quervain]: Secondary | ICD-10-CM | POA: Diagnosis not present

## 2021-04-29 NOTE — H&P (View-Only) (Signed)
? ?Office Visit Note ?  ?Patient: Kristen White           ?Date of Birth: 12/13/1970           ?MRN: [id number] ?Visit Date: 04/29/2021 ?             ?Requested by: Wilson, Amelia, MD ?[address] ?[place],  [place] [address] ?PCP: Wilson, Amelia, MD ? ? ?Assessment & Plan: ?Visit Diagnoses:  ?1. De Quervain's tenosynovitis, right   ? ? ?Plan: Patient was last seen on 03/05/2021 at which time she underwent corticosteroid injection into the right first dorsal compartment.  She got about 6 weeks of complete symptom relief.  Her pain is returned.  This is still quite bothersome for her.  We again discussed treatment options including bracing, oral anti-inflammatory medications repeat corticosteroid injection or surgical first dorsal compartment release.  After our discussion she would like to proceed with first dorsal compartment release.  We discussed the risk of surgery including but not limited to, bleeding, infection, damage to neurovascular structures, incomplete symptom relief, need for additional surgery.  She is going to discuss the leave policies for her job and will contact the office when she is ready to schedule a date and time for surgery. ? ?Follow-Up Instructions: No follow-ups on file.  ? ?Orders:  ?No orders of the defined types were placed in this encounter. ? ?No orders of the defined types were placed in this encounter. ? ? ? ? Procedures: ?No procedures performed ? ? ?Clinical Data: ?No additional findings. ? ? ?Subjective: ?Chief Complaint  ?Patient presents with  ? Right Hand - Follow-up  ? ? 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  Is been going on since mid January of this year.  She was seen in mid February at which time she underwent corticosteroid injection.  This gave her relief for about 6 weeks.  Pain is returned.  Still is quite bothersome for her.  She has difficulty with certain tasks at work  and with exercising secondary to her radial sided wrist pain.  She tried wearing a brace but this is quite uncomfortable for her. ? ? ? ?Review of Systems ? ? ?Objective: ?Vital Signs: There were no vitals taken for this visit. ? ?Physical Exam ?Constitutional:   ?   Appearance: Normal appearance.  ?Cardiovascular:  ?   Rate and Rhythm: Normal rate.  ?   Pulses: Normal pulses.  ?Pulmonary:  ?   Effort: Pulmonary effort is normal.  ?Skin: ?   General: Skin is warm and dry.  ?   Capillary Refill: Capillary refill takes less than 2 seconds.  ?Neurological:  ?   Mental Status: She is alert.  ? ? ?Right Hand Exam  ? ?Tenderness  ?Right hand tenderness location: TTP at radial styloid. ? ?Range of Motion  ?The patient has normal right wrist ROM.  ? ?Tests  ?Finkelstein's test: positive ? ?Other  ?Erythema: absent ?Sensation: normal ?Pulse: present ? ? ? ? ?Specialty Comments:  ?No specialty comments available. ? ?Imaging: ?No results found. ? ? ?PMFS History: ?Patient Active Problem List  ? Diagnosis Date Noted  ? De Quervain's tenosynovitis, right 03/05/2021  ? Lateral epicondylitis of right elbow 08/17/2020  ? Back spasm 01/31/2020  ? Sensorineural hearing loss (SNHL) of right ear 04/07/2017  ? Seasonal allergies 04/06/2017  ? Acid reflux 04/06/2017  ? ?Past Medical History:  ?Diagnosis Date  ? Allergy   ?   Family history of colonic polyps   ? GERD (gastroesophageal reflux disease)   ? Heart murmur   ?  ?Family History  ?Problem Relation Age of Onset  ? Early death Mother   ? Hypertension Father   ? Colon polyps Father   ? Alcohol abuse Maternal Uncle   ? Alcohol abuse Paternal Uncle   ? Colon polyps Paternal Uncle   ? Colon cancer Neg Hx   ? Stomach cancer Neg Hx   ? Rectal cancer Neg Hx   ? Esophageal cancer Neg Hx   ?  ?Past Surgical History:  ?Procedure Laterality Date  ? IMPLANTATION BONE ANCHORED HEARING AID Right 10/2019  ? INNER EAR SURGERY Right   ? TENOLYSIS FOREARM / WRIST    ? WISDOM TOOTH EXTRACTION     ? ?Social History  ? ?Occupational History  ? Occupation: forensic nurse  ?Tobacco Use  ? Smoking status: Never  ? Smokeless tobacco: Never  ?Vaping Use  ? Vaping Use: Never used  ?Substance and Sexual Activity  ? Alcohol use: Yes  ?  Comment: rare  ? Drug use: Never  ? Sexual activity: Not Currently  ?  Birth control/protection: None  ? ? ? ? ? ? ?

## 2021-04-29 NOTE — Progress Notes (Signed)
? ?Office Visit Note ?  ?Patient: Kristen White           ?Date of Birth: 10-14-1970           ?MRN: [id number] ?Visit Date: 04/29/2021 ?             ?Requested by: Dorna Mai, MD ?[phone] ?[place],  [place] [address] ?PCP: Dorna Mai, MD ? ? ?Assessment & Plan: ?Visit Diagnoses:  ?1. De Quervain's tenosynovitis, right   ? ? ?Plan: Patient was last seen on 03/05/2021 at which time she underwent corticosteroid injection into the right first dorsal compartment.  She got about 6 weeks of complete symptom relief.  Her pain is returned.  This is still quite bothersome for her.  We again discussed treatment options including bracing, oral anti-inflammatory medications repeat corticosteroid injection or surgical first dorsal compartment release.  After our discussion she would like to proceed with first dorsal compartment release.  We discussed the risk of surgery including but not limited to, bleeding, infection, damage to neurovascular structures, incomplete symptom relief, need for additional surgery.  She is going to discuss the leave policies for her job and will contact the office when she is ready to schedule a date and time for surgery. ? ?Follow-Up Instructions: No follow-ups on file.  ? ?Orders:  ?No orders of the defined types were placed in this encounter. ? ?No orders of the defined types were placed in this encounter. ? ? ? ? Procedures: ?No procedures performed ? ? ?Clinical Data: ?No additional findings. ? ? ?Subjective: ?Chief Complaint  ?Patient presents with  ? Right Hand - Follow-up  ? ? 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  Is been going on since mid January of this year.  She was seen in mid February at which time she underwent corticosteroid injection.  This gave her relief for about 6 weeks.  Pain is returned.  Still is quite bothersome for her.  She has difficulty with certain tasks at work  and with exercising secondary to her radial sided wrist pain.  She tried wearing a brace but this is quite uncomfortable for her. ? ? ? ?Review of Systems ? ? ?Objective: ?Vital Signs: There were no vitals taken for this visit. ? ?Physical Exam ?Constitutional:   ?   Appearance: Normal appearance.  ?Cardiovascular:  ?   Rate and Rhythm: Normal rate.  ?   Pulses: Normal pulses.  ?Pulmonary:  ?   Effort: Pulmonary effort is normal.  ?Skin: ?   General: Skin is warm and dry.  ?   Capillary Refill: Capillary refill takes less than 2 seconds.  ?Neurological:  ?   Mental Status: She is alert.  ? ? ?Right Hand Exam  ? ?Tenderness  ?Right hand tenderness location: TTP at radial styloid. ? ?Range of Motion  ?The patient has normal right wrist ROM.  ? ?Tests  ?Finkelstein's test: positive ? ?Other  ?Erythema: absent ?Sensation: normal ?Pulse: present ? ? ? ? ?Specialty Comments:  ?No specialty comments available. ? ?Imaging: ?No results found. ? ? ?PMFS History: ?Patient Active Problem List  ? Diagnosis Date Noted  ? De Quervain's tenosynovitis, right 03/05/2021  ? Lateral epicondylitis of right elbow 08/17/2020  ? Back spasm 01/31/2020  ? Sensorineural hearing loss (SNHL) of right ear 04/07/2017  ? Seasonal allergies 04/06/2017  ? Acid reflux 04/06/2017  ? ?Past Medical History:  ?Diagnosis Date  ? Allergy   ?  Family history of colonic polyps   ? GERD (gastroesophageal reflux disease)   ? Heart murmur   ?  ?Family History  ?Problem Relation Age of Onset  ? Early death Mother   ? Hypertension Father   ? Colon polyps Father   ? Alcohol abuse Maternal Uncle   ? Alcohol abuse Paternal Uncle   ? Colon polyps Paternal Uncle   ? Colon cancer Neg Hx   ? Stomach cancer Neg Hx   ? Rectal cancer Neg Hx   ? Esophageal cancer Neg Hx   ?  ?Past Surgical History:  ?Procedure Laterality Date  ? IMPLANTATION BONE ANCHORED HEARING AID Right 10/2019  ? INNER EAR SURGERY Right   ? TENOLYSIS FOREARM / WRIST    ? WISDOM TOOTH EXTRACTION     ? ?Social History  ? ?Occupational History  ? Occupation: Transport planner  ?Tobacco Use  ? Smoking status: Never  ? Smokeless tobacco: Never  ?Vaping Use  ? Vaping Use: Never used  ?Substance and Sexual Activity  ? Alcohol use: Yes  ?  Comment: rare  ? Drug use: Never  ? Sexual activity: Not Currently  ?  Birth control/protection: None  ? ? ? ? ? ? ?

## 2021-04-30 ENCOUNTER — Telehealth: Payer: Self-pay | Admitting: Orthopedic Surgery

## 2021-04-30 NOTE — Telephone Encounter (Signed)
Pt is calling back to advise she is ready for surgery  ?

## 2021-04-30 NOTE — Telephone Encounter (Signed)
Please see below.

## 2021-05-03 NOTE — Telephone Encounter (Signed)
Surgery sheet filled out by Dr. Frazier Butt. I called patient, and scheduled surgery for 05/12/21, per patient request. ?

## 2021-05-04 ENCOUNTER — Encounter (HOSPITAL_BASED_OUTPATIENT_CLINIC_OR_DEPARTMENT_OTHER): Payer: Self-pay | Admitting: Orthopedic Surgery

## 2021-05-04 ENCOUNTER — Other Ambulatory Visit: Payer: Self-pay

## 2021-05-12 ENCOUNTER — Ambulatory Visit (HOSPITAL_BASED_OUTPATIENT_CLINIC_OR_DEPARTMENT_OTHER): Payer: 59 | Admitting: Anesthesiology

## 2021-05-12 ENCOUNTER — Other Ambulatory Visit: Payer: Self-pay

## 2021-05-12 ENCOUNTER — Encounter (HOSPITAL_BASED_OUTPATIENT_CLINIC_OR_DEPARTMENT_OTHER)
Admission: RE | Disposition: A | Discharge status: Home / Self Care | Payer: Self-pay | Source: Home / Self Care | Attending: Orthopedic Surgery

## 2021-05-12 ENCOUNTER — Encounter (HOSPITAL_BASED_OUTPATIENT_CLINIC_OR_DEPARTMENT_OTHER): Payer: Self-pay | Admitting: Orthopedic Surgery

## 2021-05-12 ENCOUNTER — Ambulatory Visit (HOSPITAL_BASED_OUTPATIENT_CLINIC_OR_DEPARTMENT_OTHER)
Admission: RE | Admit: 2021-05-12 | Discharge: 2021-05-12 | Disposition: A | Discharge status: Home / Self Care | Payer: 59 | Attending: Orthopedic Surgery | Admitting: Orthopedic Surgery

## 2021-05-12 DIAGNOSIS — K219 Gastro-esophageal reflux disease without esophagitis: Secondary | ICD-10-CM | POA: Insufficient documentation

## 2021-05-12 DIAGNOSIS — M1990 Unspecified osteoarthritis, unspecified site: Secondary | ICD-10-CM | POA: Diagnosis not present

## 2021-05-12 DIAGNOSIS — M654 Radial styloid tenosynovitis [de Quervain]: Secondary | ICD-10-CM | POA: Insufficient documentation

## 2021-05-12 HISTORY — PX: DORSAL COMPARTMENT RELEASE: SHX5039

## 2021-05-12 HISTORY — DX: Other specified postprocedural states: Z98.890

## 2021-05-12 LAB — POCT PREGNANCY, URINE: Preg Test, Ur: NEGATIVE

## 2021-05-12 SURGERY — RELEASE, FIRST DORSAL COMPARTMENT, HAND
Anesthesia: General | Site: Wrist | Laterality: Right

## 2021-05-12 MED ORDER — SCOPOLAMINE 1 MG/3DAYS TD PT72
MEDICATED_PATCH | TRANSDERMAL | Status: AC
  Filled 2021-05-12: qty 1

## 2021-05-12 MED ORDER — SCOPOLAMINE 1 MG/3DAYS TD PT72
1.0000 | MEDICATED_PATCH | TRANSDERMAL | Status: DC
  Administered 2021-05-12: 1.5 mg via TRANSDERMAL

## 2021-05-12 MED ORDER — FENTANYL CITRATE (PF) 100 MCG/2ML IJ SOLN
INTRAMUSCULAR | Status: AC
  Filled 2021-05-12: qty 2

## 2021-05-12 MED ORDER — ONDANSETRON HCL 4 MG/2ML IJ SOLN
4.0000 mg | Freq: Once | INTRAMUSCULAR | Status: AC | PRN
  Administered 2021-05-12: 4 mg via INTRAVENOUS

## 2021-05-12 MED ORDER — MIDAZOLAM HCL 5 MG/5ML IJ SOLN
INTRAMUSCULAR | Status: DC | PRN
  Administered 2021-05-12: 2 mg via INTRAVENOUS

## 2021-05-12 MED ORDER — BUPIVACAINE HCL (PF) 0.25 % IJ SOLN
INTRAMUSCULAR | Status: AC
  Filled 2021-05-12: qty 30

## 2021-05-12 MED ORDER — LACTATED RINGERS IV SOLN: INTRAVENOUS | Status: DC

## 2021-05-12 MED ORDER — LIDOCAINE 2% (20 MG/ML) 5 ML SYRINGE
INTRAMUSCULAR | Status: DC | PRN
  Administered 2021-05-12: 60 mg via INTRAVENOUS

## 2021-05-12 MED ORDER — LIDOCAINE HCL (PF) 1 % IJ SOLN
INTRAMUSCULAR | Status: AC
  Filled 2021-05-12: qty 30

## 2021-05-12 MED ORDER — ACETAMINOPHEN 325 MG PO TABS: 325.0000 mg | ORAL_TABLET | ORAL | Status: DC | PRN

## 2021-05-12 MED ORDER — OXYCODONE HCL 5 MG PO TABS: 5.0000 mg | ORAL_TABLET | Freq: Once | ORAL | Status: DC | PRN

## 2021-05-12 MED ORDER — MEPERIDINE HCL 25 MG/ML IJ SOLN: 6.2500 mg | INTRAMUSCULAR | Status: DC | PRN

## 2021-05-12 MED ORDER — LIDOCAINE-EPINEPHRINE (PF) 1 %-1:200000 IJ SOLN
INTRAMUSCULAR | Status: AC
  Filled 2021-05-12: qty 30

## 2021-05-12 MED ORDER — FENTANYL CITRATE (PF) 100 MCG/2ML IJ SOLN
25.0000 ug | INTRAMUSCULAR | Status: DC | PRN
  Administered 2021-05-12: 50 ug via INTRAVENOUS

## 2021-05-12 MED ORDER — ACETAMINOPHEN 160 MG/5ML PO SOLN: 325.0000 mg | ORAL | Status: DC | PRN

## 2021-05-12 MED ORDER — MIDAZOLAM HCL 2 MG/2ML IJ SOLN
INTRAMUSCULAR | Status: AC
  Filled 2021-05-12: qty 2

## 2021-05-12 MED ORDER — LIDOCAINE HCL 1 % IJ SOLN
INTRAMUSCULAR | Status: DC | PRN
  Administered 2021-05-12: 10 mL via SUBCUTANEOUS

## 2021-05-12 MED ORDER — ONDANSETRON HCL 4 MG/2ML IJ SOLN
INTRAMUSCULAR | Status: AC
  Filled 2021-05-12: qty 2

## 2021-05-12 MED ORDER — OXYCODONE HCL 5 MG/5ML PO SOLN: 5.0000 mg | Freq: Once | ORAL | Status: DC | PRN

## 2021-05-12 MED ORDER — CEFAZOLIN SODIUM-DEXTROSE 2-4 GM/100ML-% IV SOLN
INTRAVENOUS | Status: AC
  Filled 2021-05-12: qty 100

## 2021-05-12 MED ORDER — PROPOFOL 500 MG/50ML IV EMUL
INTRAVENOUS | Status: AC
  Filled 2021-05-12: qty 50

## 2021-05-12 MED ORDER — PROPOFOL 500 MG/50ML IV EMUL
INTRAVENOUS | Status: DC | PRN
  Administered 2021-05-12: 75 ug/kg/min via INTRAVENOUS

## 2021-05-12 MED ORDER — FENTANYL CITRATE (PF) 100 MCG/2ML IJ SOLN
INTRAMUSCULAR | Status: DC | PRN
  Administered 2021-05-12: 50 ug via INTRAVENOUS

## 2021-05-12 MED ORDER — ONDANSETRON HCL 4 MG/2ML IJ SOLN
INTRAMUSCULAR | Status: DC | PRN
  Administered 2021-05-12: 4 mg via INTRAVENOUS

## 2021-05-12 SURGICAL SUPPLY — 42 items
APL PRP STRL LF DISP 70% ISPRP (MISCELLANEOUS) ×1
BLADE SURG 15 STRL LF DISP TIS (BLADE) ×1 IMPLANT
BLADE SURG 15 STRL SS (BLADE) ×2
BNDG CMPR 9X4 STRL LF SNTH (GAUZE/BANDAGES/DRESSINGS) ×1
BNDG ELASTIC 3X5.8 VLCR STR LF (GAUZE/BANDAGES/DRESSINGS) ×2 IMPLANT
BNDG ESMARK 4X9 LF (GAUZE/BANDAGES/DRESSINGS) ×2 IMPLANT
BNDG GAUZE ELAST 4 BULKY (GAUZE/BANDAGES/DRESSINGS) ×2 IMPLANT
BNDG PLASTER X FAST 3X3 WHT LF (CAST SUPPLIES) IMPLANT
BNDG PLSTR 9X3 FST ST WHT (CAST SUPPLIES)
CHLORAPREP W/TINT 26 (MISCELLANEOUS) ×2 IMPLANT
CORD BIPOLAR FORCEPS 12FT (ELECTRODE) ×2 IMPLANT
COVER BACK TABLE 60X90IN (DRAPES) ×2 IMPLANT
COVER MAYO STAND STRL (DRAPES) ×2 IMPLANT
CUFF TOURN SGL QUICK 18X4 (TOURNIQUET CUFF) IMPLANT
CUFF TOURN SGL QUICK 24 (TOURNIQUET CUFF)
CUFF TRNQT CYL 24X4X16.5-23 (TOURNIQUET CUFF) IMPLANT
DRAPE EXTREMITY T 121X128X90 (DISPOSABLE) ×2 IMPLANT
DRAPE SURG 17X23 STRL (DRAPES) ×2 IMPLANT
GAUZE XEROFORM 1X8 LF (GAUZE/BANDAGES/DRESSINGS) ×1 IMPLANT
GLOVE BIO SURGEON STRL SZ7 (GLOVE) ×2 IMPLANT
GLOVE BIOGEL PI IND STRL 7.0 (GLOVE) ×1 IMPLANT
GLOVE BIOGEL PI INDICATOR 7.0 (GLOVE) ×2
GOWN STRL REUS W/ TWL LRG LVL3 (GOWN DISPOSABLE) ×1 IMPLANT
GOWN STRL REUS W/TWL LRG LVL3 (GOWN DISPOSABLE) ×2
GOWN STRL REUS W/TWL XL LVL3 (GOWN DISPOSABLE) ×2 IMPLANT
NDL HYPO 25X1 1.5 SAFETY (NEEDLE) IMPLANT
NEEDLE HYPO 25X1 1.5 SAFETY (NEEDLE) IMPLANT
NS IRRIG 1000ML POUR BTL (IV SOLUTION) ×2 IMPLANT
PACK BASIN DAY SURGERY FS (CUSTOM PROCEDURE TRAY) ×2 IMPLANT
PAD CAST 3X4 CTTN HI CHSV (CAST SUPPLIES) ×1 IMPLANT
PADDING CAST COTTON 3X4 STRL (CAST SUPPLIES) ×2
SLEEVE SCD COMPRESS KNEE MED (STOCKING) IMPLANT
SUCTION FRAZIER HANDLE 10FR (MISCELLANEOUS)
SUCTION TUBE FRAZIER 10FR DISP (MISCELLANEOUS) IMPLANT
SUT ETHILON 4 0 PS 2 18 (SUTURE) ×2 IMPLANT
SUT MNCRL AB 3-0 PS2 18 (SUTURE) IMPLANT
SUT VICRYL 4-0 PS2 18IN ABS (SUTURE) IMPLANT
SYR BULB EAR ULCER 3OZ GRN STR (SYRINGE) ×2 IMPLANT
SYR CONTROL 10ML LL (SYRINGE) IMPLANT
TOWEL GREEN STERILE FF (TOWEL DISPOSABLE) ×4 IMPLANT
TUBE CONNECTING 20X1/4 (TUBING) IMPLANT
UNDERPAD 30X36 HEAVY ABSORB (UNDERPADS AND DIAPERS) ×2 IMPLANT

## 2021-05-12 NOTE — Transfer of Care (Signed)
Immediate Anesthesia Transfer of Care Note ? ?Patient: Kristen White ? ?Procedure(s) Performed: RIGHT RELEASE DORSAL COMPARTMENT (DEQUERVAIN) (Right: Wrist) ? ?Patient Location: PACU ? ?Anesthesia Type:MAC ? ?Level of Consciousness: drowsy and patient cooperative ? ?Airway & Oxygen Therapy: Patient Spontanous Breathing and Patient connected to face mask oxygen ? ?Post-op Assessment: Report given to RN and Post -op Vital signs reviewed and stable ? ?Post vital signs: Reviewed and stable ? ?Last Vitals:  ?Vitals Value Taken Time  ?BP    ?Temp    ?Pulse    ?Resp    ?SpO2    ? ? ?Last Pain:  ?Vitals:  ? 05/12/21 1102  ?TempSrc: Oral  ?PainSc: 5   ?   ? ?  ? ?Complications: No notable events documented. ?

## 2021-05-12 NOTE — Discharge Instructions (Addendum)
 Charles Benfield, M.D. Hand Surgery  POST-OPERATIVE DISCHARGE INSTRUCTIONS   PRESCRIPTIONS: - You have been given a prescription to be taken as directed for post-operative pain control.  You may also take over the counter ibuprofen/aleve and tylenol for pain. Take this as directed on the packaging. Do not exceed 3000 mg tylenol/acetaminophen in 24 hours.  Ibuprofen 600-800 mg (3-4) tablets by mouth every 6 hours as needed for pain.   OR  Aleve 2 tablets by mouth every 12 hours (twice daily) as needed for pain.   AND/OR  Tylenol 1000 mg (2 tablets) every 8 hours as needed for pain.  - Please use your pain medication carefully, as refills are limited and you may not be provided with one.  As stated above, please use over the counter pain medicine - it will also be helpful with decreasing your swelling.    ANESTHESIA: -After your surgery, post-surgical discomfort or pain is likely. This discomfort can last several days to a few weeks. At certain times of the day your discomfort may be more intense.   Did you receive a nerve block?   - A nerve block can provide pain relief for one hour to two days after your surgery. As long as the nerve block is working, you will experience little or no sensation in the area the surgeon operated on.  - As the nerve block wears off, you will begin to experience pain or discomfort. It is very important that you begin taking your prescribed pain medication before the nerve block fully wears off. Treating your pain at the first sign of the block wearing off will ensure your pain is better controlled and more tolerable when full-sensation returns. Do not wait until the pain is intolerable, as the medicine will be less effective. It is better to treat pain in advance than to try and catch up.   General Anesthesia:  If you did not receive a nerve block during your surgery, you will need to start taking your pain medication shortly after your surgery and  should continue to do so as prescribed by your surgeon.     ICE AND ELEVATION: - You may use ice for the first 48-72 hours, but it is not critical.   - Motion of your fingers is very important to decrease the swelling.  - Elevation, as much as possible for the next 48 hours, is critical for decreasing swelling as well as for pain relief. Elevation means when you are seated or lying down, you hand should be at or above your heart. When walking, the hand needs to be at or above the level of your elbow.  - If the bandage gets too tight, it may need to be loosened. Please contact our office and we will instruct you in how to do this.    SURGICAL BANDAGES:  - Keep your dressing and/or splint clean and dry at all times.  You can remove your dressing 4 days from now and change with a dry dressing or Band-Aids as needed thereafter. - You may place a plastic bag over your bandage to shower, but be careful, do not get your bandages wet.  - After the bandages have been removed, it is OK to get the stitches wet in a shower or with hand washing. Do Not soak or submerge the wound yet. Please do not use lotions or creams on the stitches.      HAND THERAPY:  - You may not need any. If you do,   we will begin this at your follow up visit in the clinic.    ACTIVITY AND WORK: - You are encouraged to move any fingers which are not in the bandage.  - Light use of the fingers is allowed to assist the other hand with daily hygiene and eating, but strong gripping or lifting is often uncomfortable and should be avoided.  - You might miss a variable period of time from work and hopefully this issue has been discussed prior to surgery. You may not do any heavy work with your affected hand for about 2 weeks.    [facility] [address] [place],  [place]  [address] [phone]   Post Anesthesia Home Care Instructions  Activity: Get plenty of rest for the remainder of the day. A responsible  individual must stay with you for 24 hours following the procedure.  For the next 24 hours, DO NOT: -Drive a car -Operate machinery -Drink alcoholic beverages -Take any medication unless instructed by your physician -Make any legal decisions or sign important papers.  Meals: Start with liquid foods such as gelatin or soup. Progress to regular foods as tolerated. Avoid greasy, spicy, heavy foods. If nausea and/or vomiting occur, drink only clear liquids until the nausea and/or vomiting subsides. Call your physician if vomiting continues.  Special Instructions/Symptoms: Your throat may feel dry or sore from the anesthesia or the breathing tube placed in your throat during surgery. If this causes discomfort, gargle with warm salt water. The discomfort should disappear within 24 hours.  If you had a scopolamine patch placed behind your ear for the management of post- operative nausea and/or vomiting:  1. The medication in the patch is effective for 72 hours, after which it should be removed.  Wrap patch in a tissue and discard in the trash. Wash hands thoroughly with soap and water. 2. You may remove the patch earlier than 72 hours if you experience unpleasant side effects which may include dry mouth, dizziness or visual disturbances. 3. Avoid touching the patch. Wash your hands with soap and water after contact with the patch.     

## 2021-05-12 NOTE — Anesthesia Preprocedure Evaluation (Addendum)
Anesthesia Evaluation  ?Patient identified by MRN, date of birth, ID band ?Patient awake ? ? ? ?Reviewed: ?Allergy & Precautions, H&P , NPO status , Patient's Chart, lab work & pertinent test results, reviewed documented beta blocker date and time  ? ?History of Anesthesia Complications ?(+) PONV and history of anesthetic complications ? ?Airway ?Mallampati: II ? ?TM Distance: >3 FB ?Neck ROM: full ? ? ? Dental ?no notable dental hx. ? ?  ?Pulmonary ?neg pulmonary ROS,  ?  ?Pulmonary exam normal ?breath sounds clear to auscultation ? ? ? ? ? ? Cardiovascular ?Exercise Tolerance: Good ?negative cardio ROS ? ? ?Rhythm:regular Rate:Normal ? ? ?  ?Neuro/Psych ?negative neurological ROS ? negative psych ROS  ? GI/Hepatic ?Neg liver ROS, GERD  ,  ?Endo/Other  ?negative endocrine ROS ? Renal/GU ?negative Renal ROS  ?negative genitourinary ?  ?Musculoskeletal ? ?(+) Arthritis , Osteoarthritis,   ? Abdominal ?  ?Peds ? Hematology ?negative hematology ROS ?(+)   ?Anesthesia Other Findings ? ? Reproductive/Obstetrics ?negative OB ROS ? ?  ? ? ? ? ? ? ? ? ? ? ? ? ? ?  ?  ? ? ? ? ? ? ? ?Anesthesia Physical ?Anesthesia Plan ? ?ASA: 2 ? ?Anesthesia Plan: MAC  ? ?Post-op Pain Management:   ? ?Induction: Intravenous ? ?PONV Risk Score and Plan: 3 and Ondansetron, Dexamethasone, Treatment may vary due to age or medical condition and TIVA ? ?Airway Management Planned: Natural Airway, Nasal Cannula, Simple Face Mask and Mask ? ?Additional Equipment:  ? ?Intra-op Plan:  ? ?Post-operative Plan:  ? ?Informed Consent: I have reviewed the patients History and Physical, chart, labs and discussed the procedure including the risks, benefits and alternatives for the proposed anesthesia with the patient or authorized representative who has indicated his/her understanding and acceptance.  ? ? ? ?Dental Advisory Given ? ?Plan Discussed with: CRNA and Anesthesiologist ? ?Anesthesia Plan Comments: ( )   ? ? ? ? ? ?Anesthesia Quick Evaluation ? ?

## 2021-05-12 NOTE — Brief Op Note (Signed)
05/12/2021 ? ?1:12 PM ? ?PATIENT:  Kristen White  51 y.o. female ? ?PRE-OPERATIVE DIAGNOSIS:  RIGHT DE QUERVAINS TENOSYNOVITIS ? ?POST-OPERATIVE DIAGNOSIS:  RIGHT DE QUERVAINS TENOSYNOVITIS ? ?PROCEDURE:  Procedure(s): ?RIGHT RELEASE DORSAL COMPARTMENT (DEQUERVAIN) (Right) ? ?SURGEON:  Surgeon(s) and Role: ?   * Sherilyn Cooter, MD - Primary ? ?PHYSICIAN ASSISTANT:  ? ?ASSISTANTS: none  ? ?ANESTHESIA:   local and MAC ? ?EBL:  <5 mL  ? ?BLOOD ADMINISTERED:none ? ?DRAINS: None ? ?LOCAL MEDICATIONS USED:  MARCAINE    and LIDOCAINE  ? ?SPECIMEN:  No Specimen ? ?DISPOSITION OF SPECIMEN:  N/A ? ?COUNTS:  YES ? ?TOURNIQUET:   ?Total Tourniquet Time Documented: ?Forearm (Right) - 13 minutes ?Total: Forearm (Right) - 13 minutes ? ? ?DICTATION: .Dragon Dictation ? ?PLAN OF CARE: Discharge to home after PACU ? ?PATIENT DISPOSITION:  PACU - hemodynamically stable. ?  ?Delay start of Pharmacological VTE agent (>24hrs) due to surgical blood loss or risk of bleeding: not applicable ? ?

## 2021-05-12 NOTE — Op Note (Signed)
? ?  Date of Surgery: 05/12/2021 ? ?INDICATIONS: Patient is a 51 y.o.-year-old female with right de Quervain's tenosynovitis that has failed conservative management.  Risks, benefits, and alternatives to surgery were again discussed with the patient in the preoperative area. The patient wishes to proceed with surgery.  Informed consent was signed after our discussion.  ? ?PREOPERATIVE DIAGNOSIS:  ?Right de Quervain's tenosynovitis ? ?POSTOPERATIVE DIAGNOSIS: Same. ? ?PROCEDURE:  ?Right first dorsal compartment release ? ? ?SURGEON: Audria Nine, M.D. ? ?ASSIST:  ? ?ANESTHESIA:  Local, MAC ? ?IV FLUIDS AND URINE: See anesthesia. ? ?ESTIMATED BLOOD LOSS: <5 mL. ? ?IMPLANTS: * No implants in log *  ? ?DRAINS: None ? ?COMPLICATIONS: None ? ?DESCRIPTION OF PROCEDURE: The patient was met in the preoperative holding area where the surgical site was marked and the consent form was verified.  The patient was then taken to the operating room and transferred to the operating table.  All bony prominences were well padded.  A tourniquet was applied to the right forearm.  Monitored sedation was induced.  A formal time-out was performed to confirm that this was the correct patient, surgery, side, and site. A local block was performed using 1% lidocaine and  0.25% marcaine. The operative extremity was prepped and draped in the usual and sterile fashion.   ? ?Following formal timeout, the limb was exsanguinated with an Esmarch bandage and the tourniquet inflated to 250 mmHg.  A transverse incision was made approximately 1 cm proximal to the radial styloid.  The skin was incised only.  Blunt dissection was used to identify the branches of the superficial radial nerve.  Retractors were placed both dorsal and volar to protect the nerve branches.  The first dorsal compartment identified.  The compartment was incised on its dorsal aspect in line with the APL and EPB tendons.  The underlying tendons were identified and were  healthy-appearing.  The compartment was completely released using a tenotomy scissor.  The tendons were in the same compartment with no evidence of a subsheath.  Following complete release the wound was thoroughly irrigated with copious sterile saline.  Tourniquet was let down.  Hemostasis was achieved with direct pressure over the wound.  The skin was then closed using a 4-0 nylon suture in horizontal mattress fashion. ? ?The wound was then dressed with Xeroform, folded Kerlix, and Ace wrap.  The patient was then reversed anesthesia.  She was transferred the postoperative bed.  She was taken to PACU in stable condition.  All counts were correct x2 the end the procedure. ? ?OPERATIVE PLAN: She will be discharged to home with appropriate pain medication and discharge instructions.  I will see her back in the office in 10 to 14 days for her first postoperative visit. ? ?Audria Nine, MD ?1:13 PM  ?

## 2021-05-12 NOTE — Interval H&P Note (Signed)
History and Physical Interval Note: ? ?05/12/2021 ?12:22 PM ? ?Kristen White  has presented today for surgery, with the diagnosis of RIGHT DE QUERVAINS TENOSYNOVITIS.  The various methods of treatment have been discussed with the patient and family. After consideration of risks, benefits and other options for treatment, the patient has consented to  Procedure(s): ?RIGHT RELEASE DORSAL COMPARTMENT (DEQUERVAIN) (Right) as a surgical intervention.  The patient's history has been reviewed, patient examined, no change in status, stable for surgery.  I have reviewed the patient's chart and labs.  Questions were answered to the patient's satisfaction.   ? ? ?Anzleigh Slaven Oswald Pott ? ? ?

## 2021-05-12 NOTE — Anesthesia Postprocedure Evaluation (Signed)
Anesthesia Post Note ? ?Patient: Kristen White ? ?Procedure(s) Performed: RIGHT RELEASE DORSAL COMPARTMENT (DEQUERVAIN) (Right: Wrist) ? ?  ? ?Patient location during evaluation: PACU ?Anesthesia Type: General ?Level of consciousness: awake and alert ?Pain management: pain level controlled ?Vital Signs Assessment: post-procedure vital signs reviewed and stable ?Respiratory status: spontaneous breathing, nonlabored ventilation, respiratory function stable and patient connected to nasal cannula oxygen ?Cardiovascular status: blood pressure returned to baseline and stable ?Postop Assessment: no apparent nausea or vomiting ?Anesthetic complications: no ? ? ?No notable events documented. ? ?Last Vitals:  ?Vitals:  ? 05/12/21 1345 05/12/21 1437  ?BP: 117/68 112/88  ?Pulse: 72 66  ?Resp: 15 16  ?Temp:  36.6 ?C  ?SpO2: 98% 98%  ?  ?Last Pain:  ?Vitals:  ? 05/12/21 1409  ?TempSrc:   ?PainSc: 1   ? ? ?  ?  ?  ?  ?  ?  ? ?[name] ? ? ? ? ?

## 2021-05-12 NOTE — Anesthesia Procedure Notes (Signed)
Procedure Name: Clarence ?Date/Time: 05/12/2021 12:59 PM ?Performed by: Signe Colt, CRNA ?Pre-anesthesia Checklist: Patient identified, Emergency Drugs available, Suction available, Patient being monitored and Timeout performed ?Patient Re-evaluated:Patient Re-evaluated prior to induction ?Oxygen Delivery Method: Simple face mask ? ? ? ? ?

## 2021-05-24 ENCOUNTER — Ambulatory Visit (INDEPENDENT_AMBULATORY_CARE_PROVIDER_SITE_OTHER): Payer: 59 | Admitting: Orthopedic Surgery

## 2021-05-24 DIAGNOSIS — M654 Radial styloid tenosynovitis [de Quervain]: Secondary | ICD-10-CM

## 2021-05-24 NOTE — Progress Notes (Signed)
? ?  Post-Op Visit Note ?  ?Patient: Kristen White           ?Date of Birth: 08-Dec-1970           ?MRN: [id number] ?Visit Date: 05/24/2021 ?PCP: Dorna Mai, MD ? ? ?Assessment & Plan: ? ?Chief Complaint: No chief complaint on file. ? ?Visit Diagnoses:  ?1. De Quervain's tenosynovitis, right   ? ? ?Plan: Patient is just under two weeks s/p right first dorsal compartment release.  She is doing well postoperatively.  The incision remains well approximated without surrounding erythema or induration.  She has no numbness in the superficial radial nerve distribution.  Her pain is much improved from before surgery.  She can follow up with me again as needed.  ? ?Follow-Up Instructions: No follow-ups on file.  ? ?Orders:  ?No orders of the defined types were placed in this encounter. ? ?No orders of the defined types were placed in this encounter. ? ? ?Imaging: ?No results found. ? ?PMFS History: ?Patient Active Problem List  ? Diagnosis Date Noted  ? De Quervain's tenosynovitis, right 03/05/2021  ? Lateral epicondylitis of right elbow 08/17/2020  ? Back spasm 01/31/2020  ? Sensorineural hearing loss (SNHL) of right ear 04/07/2017  ? Seasonal allergies 04/06/2017  ? Acid reflux 04/06/2017  ? ?Past Medical History:  ?Diagnosis Date  ? Allergy   ? Family history of colonic polyps   ? GERD (gastroesophageal reflux disease)   ? Heart murmur   ? PONV (postoperative nausea and vomiting)   ?  ?Family History  ?Problem Relation Age of Onset  ? Early death Mother   ? Hypertension Father   ? Colon polyps Father   ? Alcohol abuse Maternal Uncle   ? Alcohol abuse Paternal Uncle   ? Colon polyps Paternal Uncle   ? Colon cancer Neg Hx   ? Stomach cancer Neg Hx   ? Rectal cancer Neg Hx   ? Esophageal cancer Neg Hx   ?  ?Past Surgical History:  ?Procedure Laterality Date  ? DORSAL COMPARTMENT RELEASE Right 05/12/2021  ? Procedure: RIGHT RELEASE DORSAL COMPARTMENT (DEQUERVAIN);  Surgeon: Sherilyn Cooter, MD;  Location: [place];  Service: Orthopedics;  Laterality: Right;  ? IMPLANTATION BONE ANCHORED HEARING AID Right 10/2019  ? INNER EAR SURGERY Right   ? TENOLYSIS FOREARM / WRIST    ? WISDOM TOOTH EXTRACTION    ? ?Social History  ? ?Occupational History  ? Occupation: Transport planner  ?Tobacco Use  ? Smoking status: Never  ? Smokeless tobacco: Never  ?Vaping Use  ? Vaping Use: Never used  ?Substance and Sexual Activity  ? Alcohol use: Yes  ?  Comment: rare  ? Drug use: Never  ? Sexual activity: Not Currently  ?  Birth control/protection: Injection  ? ? ? ?

## 2021-05-25 ENCOUNTER — Other Ambulatory Visit: Payer: Self-pay | Admitting: Internal Medicine

## 2021-05-25 ENCOUNTER — Other Ambulatory Visit: Payer: Self-pay | Admitting: Family Medicine

## 2021-05-25 DIAGNOSIS — Z1231 Encounter for screening mammogram for malignant neoplasm of breast: Secondary | ICD-10-CM

## 2021-06-04 ENCOUNTER — Ambulatory Visit: Payer: 59

## 2021-06-10 ENCOUNTER — Ambulatory Visit: Payer: 59

## 2021-06-11 ENCOUNTER — Ambulatory Visit
Admission: RE | Admit: 2021-06-11 | Discharge: 2021-06-11 | Disposition: A | Discharge status: Home / Self Care | Payer: 59 | Source: Ambulatory Visit | Attending: Family Medicine | Admitting: Family Medicine

## 2021-06-11 DIAGNOSIS — Z1231 Encounter for screening mammogram for malignant neoplasm of breast: Secondary | ICD-10-CM | POA: Diagnosis not present

## 2021-07-30 ENCOUNTER — Ambulatory Visit: Payer: 59 | Admitting: Family Medicine

## 2021-07-30 ENCOUNTER — Encounter: Payer: Self-pay | Admitting: Family Medicine

## 2021-07-30 VITALS — BP 117/79 | HR 79 | Temp 98.1°F | Resp 16 | Wt 174.8 lb

## 2021-07-30 DIAGNOSIS — N951 Menopausal and female climacteric states: Secondary | ICD-10-CM | POA: Diagnosis not present

## 2021-07-30 DIAGNOSIS — E663 Overweight: Secondary | ICD-10-CM | POA: Diagnosis not present

## 2021-07-30 DIAGNOSIS — E785 Hyperlipidemia, unspecified: Secondary | ICD-10-CM

## 2021-07-30 NOTE — Progress Notes (Unsigned)
Patient is  here for 6 month f/u Patient has no other concerns today

## 2021-07-31 LAB — LIPID PANEL
Chol/HDL Ratio: 3.2 ratio (ref 0.0–4.4)
Cholesterol, Total: 185 mg/dL (ref 100–199)
HDL: 57 mg/dL (ref >39)
LDL Chol Calc (NIH): 118 mg/dL — ABNORMAL HIGH (ref 0–99)
Triglycerides: 49 mg/dL (ref 0–149)
VLDL Cholesterol Cal: 10 mg/dL (ref 5–40)

## 2021-08-02 NOTE — Progress Notes (Signed)
Established Patient Office Visit  Subjective    Patient ID: Kristen White, female    DOB: 1970/12/31  Age: 51 y.o. MRN: [id number]  CC:  Chief Complaint  Patient presents with   Follow-up    HPI PA TENNANT presents for follow up of hyperlipidemia.. Patient also reports perimenopausal sx. And desiring weight loss.    Outpatient Encounter Medications as of 07/30/2021  Medication Sig   medroxyPROGESTERone (DEPO-PROVERA) 150 MG/ML injection Inject into the muscle.   cetirizine (ZYRTEC) 10 MG tablet Take 10 mg by mouth daily.   Cholecalciferol (D3-1000 PO) Take 1 each by mouth daily.   ibuprofen (ADVIL) 400 MG tablet Take 400 mg by mouth every 6 (six) hours as needed for mild pain.   medroxyPROGESTERone (DEPO-PROVERA) 150 MG/ML injection Inject 150 mg into the muscle every 3 (three) months.   medroxyPROGESTERone Acetate 150 MG/ML SUSY INJECT 1 ML INTO THE MUSCLE ONCE FOR 1 DOSE. INJECT 1 ML INTO THE MUSCLE EVERY 3 MONTHS.   methocarbamol (ROBAXIN) 500 MG tablet Take 1 tablet (500 mg total) by mouth every 8 (eight) hours as needed for muscle spasms.   Multiple Vitamin (MULTIVITAMIN) tablet Take 1 tablet by mouth daily.   No facility-administered encounter medications on file as of 07/30/2021.    Past Medical History:  Diagnosis Date   Allergy    Family history of colonic polyps    GERD (gastroesophageal reflux disease)    Heart murmur    PONV (postoperative nausea and vomiting)     Past Surgical History:  Procedure Laterality Date   DORSAL COMPARTMENT RELEASE Right 05/12/2021   Procedure: RIGHT RELEASE DORSAL COMPARTMENT (DEQUERVAIN);  Surgeon: Marlyne Beards, MD;  Location: [facility];  Service: Orthopedics;  Laterality: Right;   IMPLANTATION BONE ANCHORED HEARING AID Right 10/2019   INNER EAR SURGERY Right    TENOLYSIS FOREARM / WRIST     WISDOM TOOTH EXTRACTION      Family History  Problem Relation Age of Onset   Early death Mother     Hypertension Father    Colon polyps Father    Alcohol abuse Maternal Uncle    Alcohol abuse Paternal Uncle    Colon polyps Paternal Uncle    Colon cancer Neg Hx    Stomach cancer Neg Hx    Rectal cancer Neg Hx    Esophageal cancer Neg Hx     Social History   Socioeconomic History   Marital status: Single    Spouse name: Not on file   Number of children: Not on file   Years of education: Not on file   Highest education level: Not on file  Occupational History   Occupation: Leisure centre manager  Tobacco Use   Smoking status: Never   Smokeless tobacco: Never  Vaping Use   Vaping Use: Never used  Substance and Sexual Activity   Alcohol use: Yes    Comment: rare   Drug use: Never   Sexual activity: Not Currently    Birth control/protection: Injection  Other Topics Concern   Not on file  Social History Narrative   Not on file   Social Determinants of Health   Financial Resource Strain: Not on file  Food Insecurity: Not on file  Transportation Needs: Not on file  Physical Activity: Not on file  Stress: Not on file  Social Connections: Not on file  Intimate Partner Violence: Not on file    Review of Systems  All other systems reviewed and are  negative.       Objective    BP 117/79   Pulse 79   Temp 98.1 F (36.7 C) (Oral)   Resp 16   Wt 174 lb 12.8 oz (79.3 kg)   SpO2 98%   BMI 29.09 kg/m   Physical Exam Vitals and nursing note reviewed.  Constitutional:      General: She is not in acute distress. Cardiovascular:     Rate and Rhythm: Normal rate and regular rhythm.  Pulmonary:     Effort: Pulmonary effort is normal.     Breath sounds: Normal breath sounds.  Abdominal:     Palpations: Abdomen is soft.     Tenderness: There is no abdominal tenderness.  Neurological:     General: No focal deficit present.     Mental Status: She is alert and oriented to person, place, and time.         Assessment & Plan:   1. Hyperlipidemia, unspecified  hyperlipidemia type Monitoring labs ordered - Lipid Panel  2. Perimenopause Discussed symptoms management  3. Overweight Discussed dietary and activity options. Goal is 2-4lbs/mo wt loss.   Return in about 6 months (around 01/30/2022) for physical.   Tommie Raymond, MD

## 2021-08-05 ENCOUNTER — Other Ambulatory Visit: Payer: Self-pay | Admitting: Family Medicine

## 2021-08-05 NOTE — Telephone Encounter (Signed)
Medication Refill - Medication: medroxyPROGESTERone (DEPO-PROVERA) 150 MG/ML injection   Has the patient contacted their pharmacy? yes (Agent: If no, request that the patient contact the pharmacy for the refill. If patient does not wish to contact the pharmacy document the reason why and proceed with request.) (Agent: If yes, when and what did the pharmacy advise?)contact pcp  Preferred Pharmacy (with phone number or street name):  [id number] - [place], [place] - [address] Phone:  [phone]  Fax:  [phone]     Has the patient been seen for an appointment in the last year OR does the patient have an upcoming appointment? yes  Agent: Please be advised that RX refills may take up to 3 business days. We ask that you follow-up with your pharmacy.

## 2021-08-06 NOTE — Telephone Encounter (Signed)
Requested medications are due for refill today.  no  Requested medications are on the active medications list.  yes  Last refill. 07/30/2021 150mg   Future visit scheduled.   no  Notes to clinic.  No protocol assigned to refill. Historical provider last ordered on 07/30/2021.    Requested Prescriptions  Pending Prescriptions Disp Refills   medroxyPROGESTERone (DEPO-PROVERA) 150 MG/ML injection 1 mL     Sig: Inject 1 mL (150 mg total) into the muscle every 3 (three) months.     Off-Protocol Failed - 08/05/2021 11:22 AM      Failed - Medication not assigned to a protocol, review manually.      Passed - Valid encounter within last 12 months    Recent Outpatient Visits           1 week ago Hyperlipidemia, unspecified hyperlipidemia type   Primary Care at [facility], MD   5 months ago [facility] Quervain's disease (tenosynovitis)   Primary Care at [facility], MD   6 months ago Well woman exam (no gynecological exam)   Primary Care at [facility], MD   8 months ago Class 1 obesity due to excess calories without serious comorbidity with body mass index (BMI) of 33.0 to 33.9 in adult   Primary Care at [facility], MD   1 year ago Encounter for annual physical exam   Primary Care at [facility], MD

## 2021-08-09 NOTE — Telephone Encounter (Signed)
Pt is calling to check on the status medication [id number]- [id number]

## 2021-08-10 ENCOUNTER — Encounter: Payer: Self-pay | Admitting: Family Medicine

## 2021-08-10 ENCOUNTER — Other Ambulatory Visit: Payer: Self-pay

## 2021-08-10 DIAGNOSIS — Z3042 Encounter for surveillance of injectable contraceptive: Secondary | ICD-10-CM

## 2021-08-10 MED ORDER — MEDROXYPROGESTERONE ACETATE 150 MG/ML IM SUSY
150.0000 mg | PREFILLED_SYRINGE | INTRAMUSCULAR | 3 refills | Status: AC
  Filled 2022-02-02: qty 1, 90d supply, fill #0
  Filled 2022-04-27: qty 1, 90d supply, fill #1

## 2021-11-19 ENCOUNTER — Other Ambulatory Visit: Payer: Self-pay

## 2021-12-28 ENCOUNTER — Encounter: Payer: Self-pay | Admitting: Orthopaedic Surgery

## 2021-12-28 ENCOUNTER — Ambulatory Visit (INDEPENDENT_AMBULATORY_CARE_PROVIDER_SITE_OTHER): Payer: 59 | Admitting: Orthopaedic Surgery

## 2021-12-28 ENCOUNTER — Ambulatory Visit (INDEPENDENT_AMBULATORY_CARE_PROVIDER_SITE_OTHER): Payer: 59

## 2021-12-28 DIAGNOSIS — M1711 Unilateral primary osteoarthritis, right knee: Secondary | ICD-10-CM

## 2021-12-28 DIAGNOSIS — G8929 Other chronic pain: Secondary | ICD-10-CM | POA: Insufficient documentation

## 2021-12-28 NOTE — Progress Notes (Signed)
Office Visit Note   Patient: Kristen White           Date of Birth: [birth year]           MRN: [id number] Visit Date: 12/28/2021              Requested by: Georganna Skeans, MD [address] [place],  [place] [address] PCP: Georganna Skeans, MD   Assessment & Plan: Visit Diagnoses:  1. Primary osteoarthritis of right knee     Plan: Impression is right knee medial compartment OA flareup.  At this point, we discussed various treatment options to include activity modification versus oral NSAIDs versus cortisone injection.  She notes that her symptoms are not bad enough to proceed with injection.  She will let pain be her guide with exercise.  She will follow-up as needed.  Follow-Up Instructions: Return if symptoms worsen or fail to improve.   Orders:  Orders Placed This Encounter  Procedures   XR KNEE 3 VIEW RIGHT   No orders of the defined types were placed in this encounter.     Procedures: No procedures performed   Clinical Data: No additional findings.   Subjective: Chief Complaint  Patient presents with   Right Knee - Pain    HPI patient is a pleasant 51 year old female who comes in today with right knee pain for the past 6 to 8 weeks.  She denies any injury or change in activity but notes that she has been on a weight loss program where she exercises 6 days a week since this past January.  The pain she has is primarily to the medial aspect with occasional radiation to the anterior knee.  She notes that the pain is an occasional sharp shooting pain worse with periodic squatting.  She takes occasional ibuprofen.  No previous cortisone injection to the right knee.  Review of Systems as detailed in HPI.  All others reviewed and are negative.   Objective: Vital Signs: There were no vitals taken for this visit.  Physical Exam well-developed well-nourished female no acute distress.  Alert and oriented x 3.  Ortho Exam right knee exam shows no effusion.   Range of motion 0 to 130 degrees.  No joint line tenderness.  Patellofemoral crepitus.  Ligaments stable.  She is neurovascular intact distally.  Specialty Comments:  No specialty comments available.  Imaging: No results found.   PMFS History: Patient Active Problem List   Diagnosis Date Noted   Chronic pain of right knee 12/28/2021   De Quervain's tenosynovitis, right 03/05/2021   Lateral epicondylitis of right elbow 08/17/2020   Back spasm 01/31/2020   Sensorineural hearing loss (SNHL) of right ear 04/07/2017   Seasonal allergies 04/06/2017   Acid reflux 04/06/2017   Contraceptive management 08/07/2009   Encounter for routine gynecological examination 04/13/2009   Past Medical History:  Diagnosis Date   Allergy    Family history of colonic polyps    GERD (gastroesophageal reflux disease)    Heart murmur    PONV (postoperative nausea and vomiting)     Family History  Problem Relation Age of Onset   Early death Mother    Hypertension Father    Colon polyps Father    Alcohol abuse Maternal Uncle    Alcohol abuse Paternal Uncle    Colon polyps Paternal Uncle    Colon cancer Neg Hx    Stomach cancer Neg Hx    Rectal cancer Neg Hx    Esophageal cancer  Neg Hx     Past Surgical History:  Procedure Laterality Date   DORSAL COMPARTMENT RELEASE Right 05/12/2021   Procedure: RIGHT RELEASE DORSAL COMPARTMENT (DEQUERVAIN);  Surgeon: Sherilyn Cooter, MD;  Location: [facility];  Service: Orthopedics;  Laterality: Right;   IMPLANTATION BONE ANCHORED HEARING AID Right 10/2019   INNER EAR SURGERY Right    TENOLYSIS FOREARM / WRIST     WISDOM TOOTH EXTRACTION     Social History   Occupational History   Occupation: Transport planner  Tobacco Use   Smoking status: Never   Smokeless tobacco: Never  Vaping Use   Vaping Use: Never used  Substance and Sexual Activity   Alcohol use: Yes    Comment: rare   Drug use: Never   Sexual activity: Not Currently    Birth  control/protection: Injection

## 2022-01-21 IMAGING — MG MM DIGITAL SCREENING BILAT W/ TOMO AND CAD
8 series · 8 of 24 positions shown · non-contrast
Comparison: Previous exam(s).

CLINICAL DATA: Screening.

EXAM:
DIGITAL SCREENING BILATERAL MAMMOGRAM WITH TOMOSYNTHESIS AND CAD
TECHNIQUE: Bilateral screening digital craniocaudal and mediolateral oblique
mammograms were obtained. Bilateral screening digital breast
tomosynthesis was performed. The images were evaluated with
computer-aided detection.

[L MLO synth-2D]
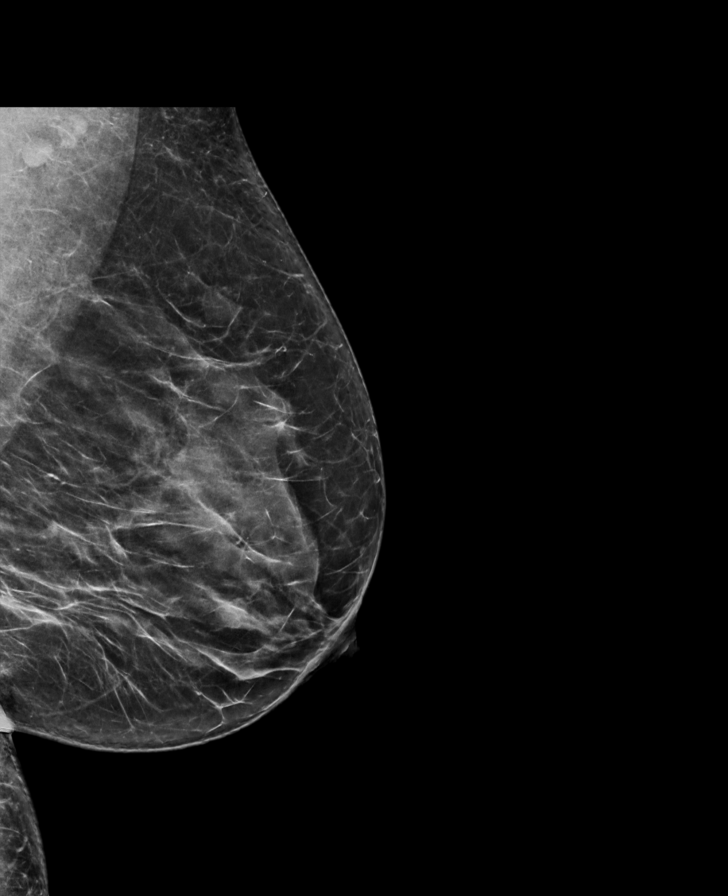

[L CC synth-2D]
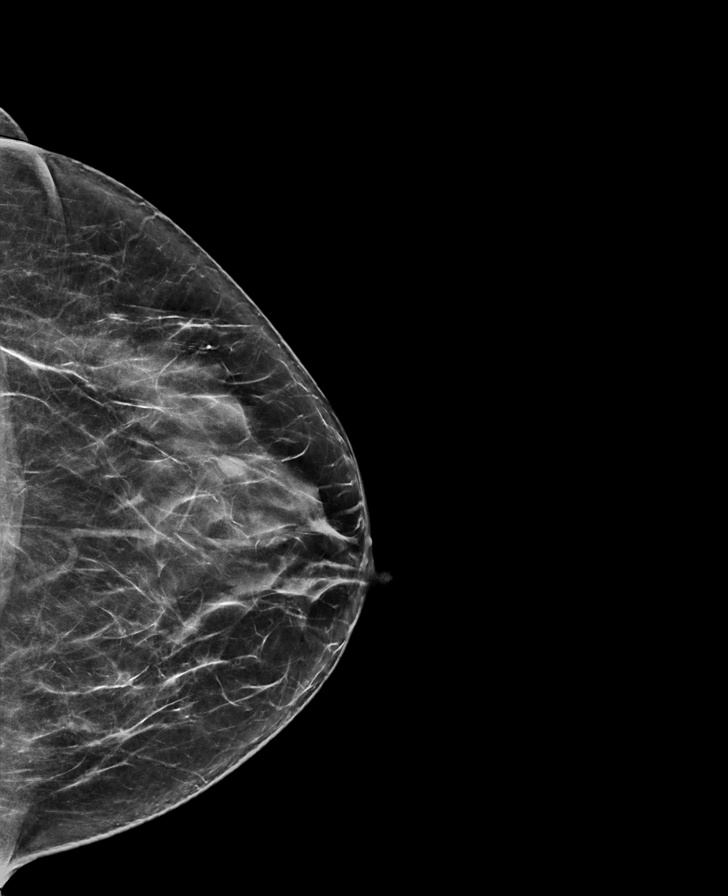

[R CC synth-2D]
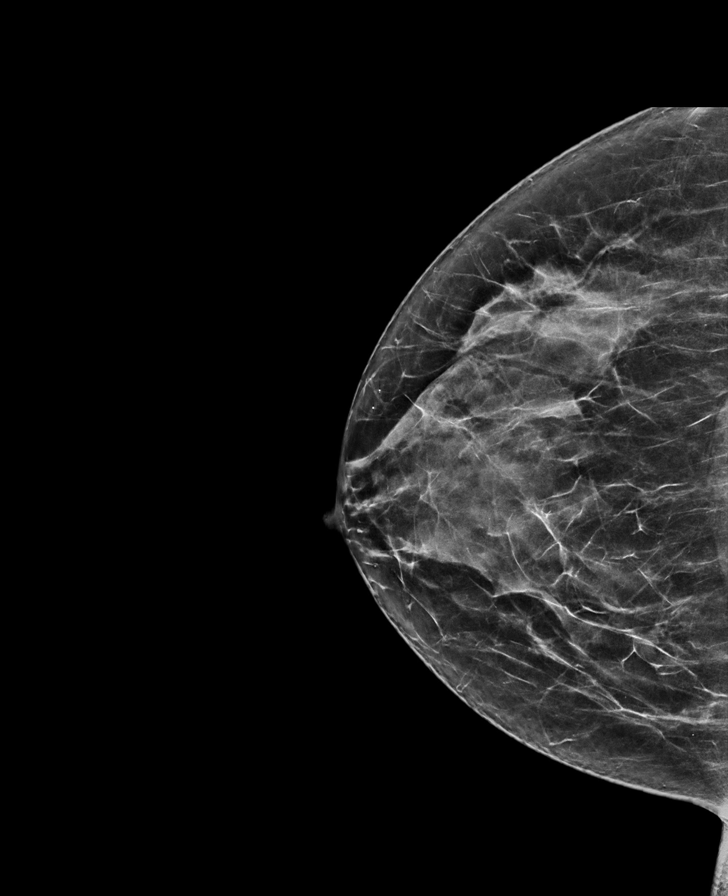

[R MLO synth-2D]
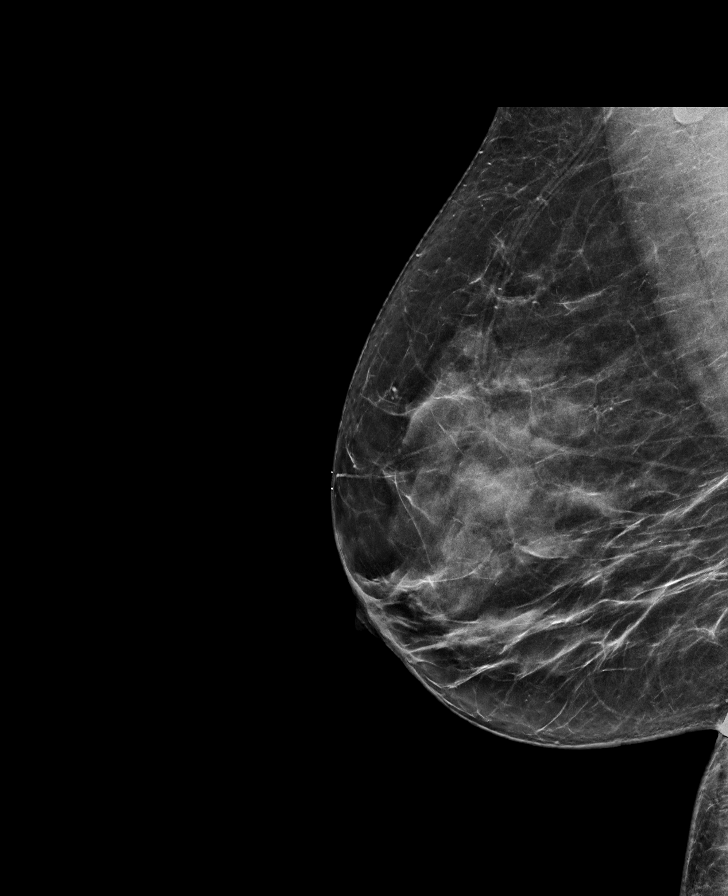

[R MLO tomo · tomo slice 37/72.0]
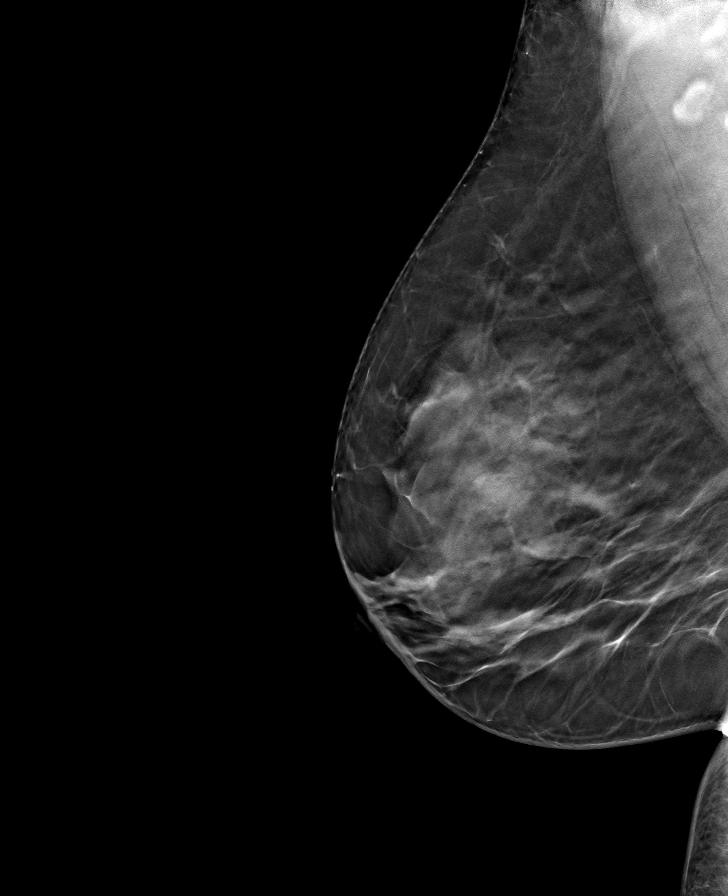

[L MLO tomo · tomo slice 37/73.0]
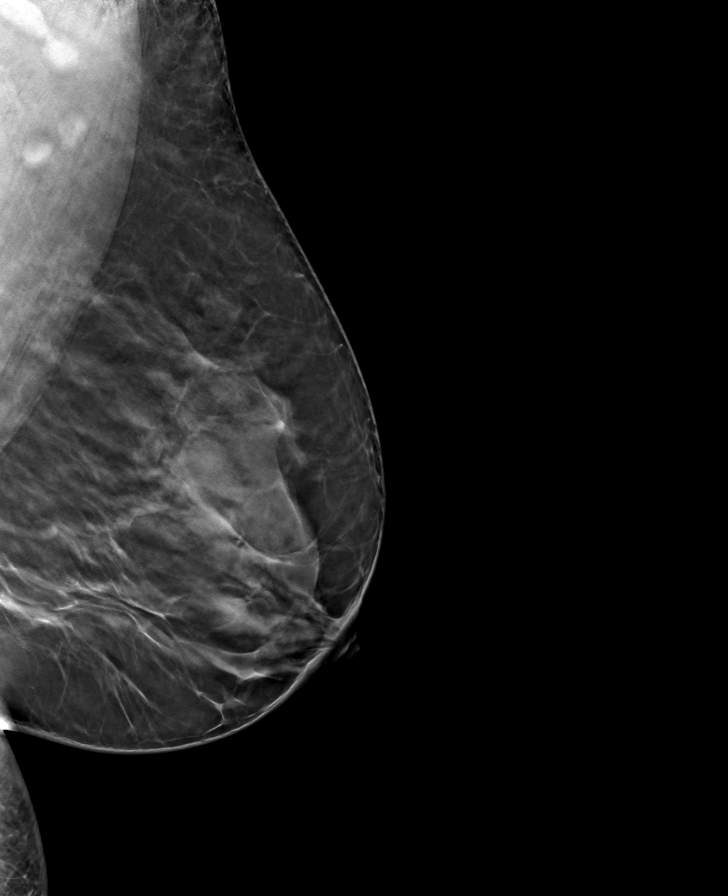

[L CC tomo · tomo slice 35/70.0]
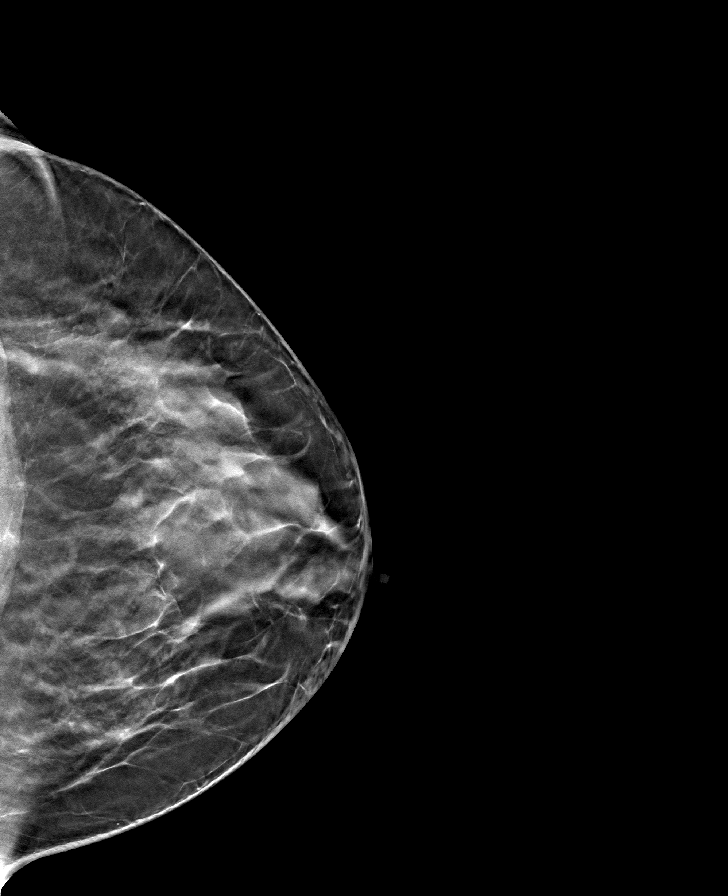

[R CC tomo · tomo slice 34/67.0]
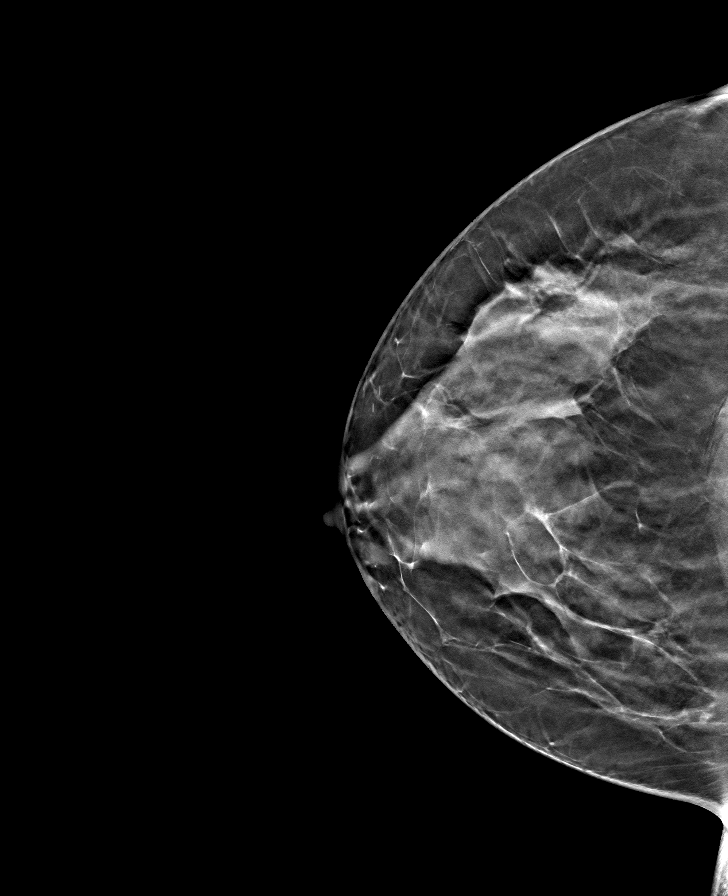

[8 of 24 positions shown; findings below may reference images not displayed]

ACR Breast Density Category c: The breast tissue is heterogeneously
dense, which may obscure small masses.
FINDINGS: There are no findings suspicious for malignancy. The images were
evaluated with computer-aided detection.
IMPRESSION: No mammographic evidence of malignancy. A result letter of this
screening mammogram will be mailed directly to the patient.

RECOMMENDATION:
Screening mammogram in one year. (Code:T4-5-GWO)

BI-RADS CATEGORY  1: Negative.

## 2022-02-02 ENCOUNTER — Other Ambulatory Visit (HOSPITAL_COMMUNITY): Payer: Self-pay

## 2022-02-23 ENCOUNTER — Encounter: Payer: Self-pay | Admitting: Family Medicine

## 2022-02-23 ENCOUNTER — Other Ambulatory Visit (HOSPITAL_BASED_OUTPATIENT_CLINIC_OR_DEPARTMENT_OTHER): Payer: Self-pay

## 2022-02-23 MED ORDER — COMIRNATY 30 MCG/0.3ML IM SUSY
PREFILLED_SYRINGE | INTRAMUSCULAR | 0 refills | Status: AC
  Filled 2022-02-23: qty 0.3, 1d supply, fill #0

## 2022-02-25 ENCOUNTER — Encounter: Payer: Self-pay | Admitting: Family Medicine

## 2022-02-25 ENCOUNTER — Ambulatory Visit (INDEPENDENT_AMBULATORY_CARE_PROVIDER_SITE_OTHER): Payer: Commercial Managed Care - PPO | Admitting: Family Medicine

## 2022-02-25 VITALS — BP 116/72 | HR 88 | Temp 98.4°F | Resp 16 | Ht 65.0 in | Wt 167.0 lb

## 2022-02-25 DIAGNOSIS — Z130 Encounter for screening for diseases of the blood and blood-forming organs and certain disorders involving the immune mechanism: Secondary | ICD-10-CM | POA: Diagnosis not present

## 2022-02-25 DIAGNOSIS — Z13220 Encounter for screening for lipoid disorders: Secondary | ICD-10-CM | POA: Diagnosis not present

## 2022-02-25 DIAGNOSIS — Z0000 Encounter for general adult medical examination without abnormal findings: Secondary | ICD-10-CM

## 2022-02-25 DIAGNOSIS — Z13228 Encounter for screening for other metabolic disorders: Secondary | ICD-10-CM | POA: Diagnosis not present

## 2022-02-25 DIAGNOSIS — Z1329 Encounter for screening for other suspected endocrine disorder: Secondary | ICD-10-CM | POA: Diagnosis not present

## 2022-02-26 LAB — CBC WITH DIFFERENTIAL/PLATELET
Basophils Absolute: 0 10*3/uL (ref 0.0–0.2)
Basos: 1 %
EOS (ABSOLUTE): 0.1 10*3/uL (ref 0.0–0.4)
Eos: 2 %
Hematocrit: 41.7 % (ref 34.0–46.6)
Hemoglobin: 13.5 g/dL (ref 11.1–15.9)
Immature Grans (Abs): 0 10*3/uL (ref 0.0–0.1)
Immature Granulocytes: 0 %
Lymphocytes Absolute: 1.8 10*3/uL (ref 0.7–3.1)
Lymphs: 42 %
MCH: 28.7 pg (ref 26.6–33.0)
MCHC: 32.4 g/dL (ref 31.5–35.7)
MCV: 89 fL (ref 79–97)
Monocytes Absolute: 0.5 10*3/uL (ref 0.1–0.9)
Monocytes: 11 %
Neutrophils Absolute: 1.9 10*3/uL (ref 1.4–7.0)
Neutrophils: 44 %
Platelets: 328 10*3/uL (ref 150–450)
RBC: 4.7 x10E6/uL (ref 3.77–5.28)
RDW: 12.5 % (ref 11.7–15.4)
WBC: 4.4 10*3/uL (ref 3.4–10.8)

## 2022-02-26 LAB — CMP14+EGFR
ALT: 16 IU/L (ref 0–32)
AST: 17 IU/L (ref 0–40)
Albumin/Globulin Ratio: 1.8 (ref 1.2–2.2)
Albumin: 4.2 g/dL (ref 3.9–4.9)
Alkaline Phosphatase: 77 IU/L (ref 44–121)
BUN/Creatinine Ratio: 15 (ref 9–23)
BUN: 15 mg/dL (ref 6–24)
Bilirubin Total: 0.3 mg/dL (ref 0.0–1.2)
CO2: 19 mmol/L — ABNORMAL LOW (ref 20–29)
Calcium: 9.4 mg/dL (ref 8.7–10.2)
Chloride: 104 mmol/L (ref 96–106)
Creatinine, Ser: 1.02 mg/dL — ABNORMAL HIGH (ref 0.57–1.00)
Globulin, Total: 2.4 g/dL (ref 1.5–4.5)
Glucose: 75 mg/dL (ref 70–99)
Potassium: 4.6 mmol/L (ref 3.5–5.2)
Sodium: 141 mmol/L (ref 134–144)
Total Protein: 6.6 g/dL (ref 6.0–8.5)
eGFR: 67 mL/min/{1.73_m2} (ref >59)

## 2022-02-26 LAB — LIPID PANEL
Chol/HDL Ratio: 3 ratio (ref 0.0–4.4)
Cholesterol, Total: 193 mg/dL (ref 100–199)
HDL: 65 mg/dL (ref >39)
LDL Chol Calc (NIH): 121 mg/dL — ABNORMAL HIGH (ref 0–99)
Triglycerides: 38 mg/dL (ref 0–149)
VLDL Cholesterol Cal: 7 mg/dL (ref 5–40)

## 2022-02-26 LAB — VITAMIN D 25 HYDROXY (VIT D DEFICIENCY, FRACTURES): Vit D, 25-Hydroxy: 43.8 ng/mL (ref 30.0–100.0)

## 2022-02-26 LAB — TSH: TSH: 1.57 u[IU]/mL (ref 0.450–4.500)

## 2022-02-27 ENCOUNTER — Encounter: Payer: Self-pay | Admitting: Family Medicine

## 2022-02-28 ENCOUNTER — Encounter: Payer: Self-pay | Admitting: Family Medicine

## 2022-02-28 NOTE — Progress Notes (Signed)
Established Patient Office Visit  Subjective    Patient ID: Kristen White, female    DOB: 04-05-1970  Age: 52 y.o. MRN: [id number]  CC:  Chief Complaint  Patient presents with   Annual Exam    HPI Kristen White presents for routine annual exam. Patient denies acute complaints or concerns.    Outpatient Encounter Medications as of 02/25/2022  Medication Sig   cetirizine (ZYRTEC) 10 MG tablet Take 10 mg by mouth daily.   COVID-19 mRNA vaccine 2023-2024 (COMIRNATY) syringe Inject into the muscle.   ibuprofen (ADVIL) 400 MG tablet Take 400 mg by mouth every 6 (six) hours as needed for mild pain.   medroxyPROGESTERone Acetate 150 MG/ML SUSY Inject 1 mL (150 mg total) into the muscle every 3 (three) months.   methocarbamol (ROBAXIN) 500 MG tablet Take 1 tablet (500 mg total) by mouth every 8 (eight) hours as needed for muscle spasms.   [DISCONTINUED] Cholecalciferol (D3-1000 PO) Take 1 each by mouth daily.   No facility-administered encounter medications on file as of 02/25/2022.    Past Medical History:  Diagnosis Date   Allergy    Family history of colonic polyps    GERD (gastroesophageal reflux disease)    Heart murmur    PONV (postoperative nausea and vomiting)     Past Surgical History:  Procedure Laterality Date   DORSAL COMPARTMENT RELEASE Right 05/12/2021   Procedure: RIGHT RELEASE DORSAL COMPARTMENT (DEQUERVAIN);  Surgeon: Sherilyn Cooter, MD;  Location: [facility];  Service: Orthopedics;  Laterality: Right;   IMPLANTATION BONE ANCHORED HEARING AID Right 10/2019   INNER EAR SURGERY Right    TENOLYSIS FOREARM / WRIST     WISDOM TOOTH EXTRACTION      Family History  Problem Relation Age of Onset   Early death Mother    Hypertension Father    Colon polyps Father    Alcohol abuse Maternal Uncle    Alcohol abuse Paternal Uncle    Colon polyps Paternal Uncle    Colon cancer Neg Hx    Stomach cancer Neg Hx    Rectal cancer Neg Hx    Esophageal  cancer Neg Hx     Social History   Socioeconomic History   Marital status: Single    Spouse name: Not on file   Number of children: Not on file   Years of education: Not on file   Highest education level: Not on file  Occupational History   Occupation: Transport planner  Tobacco Use   Smoking status: Never   Smokeless tobacco: Never  Vaping Use   Vaping Use: Never used  Substance and Sexual Activity   Alcohol use: Yes    Comment: rare   Drug use: Never   Sexual activity: Not Currently    Birth control/protection: Injection  Other Topics Concern   Not on file  Social History Narrative   Not on file   Social Determinants of Health   Financial Resource Strain: Not on file  Food Insecurity: Not on file  Transportation Needs: Not on file  Physical Activity: Not on file  Stress: Not on file  Social Connections: Not on file  Intimate Partner Violence: Not on file    Review of Systems  All other systems reviewed and are negative.       Objective    BP 116/72   Pulse 88   Temp 98.4 F (36.9 C) (Oral)   Resp 16   Ht 5' 5"$  (1.651 m)  Wt 167 lb (75.8 kg)   SpO2 97%   BMI 27.79 kg/m   Physical Exam Vitals and nursing note reviewed.  Constitutional:      General: She is not in acute distress. HENT:     Head: Normocephalic and atraumatic.     Right Ear: Tympanic membrane, ear canal and external ear normal.     Left Ear: Tympanic membrane, ear canal and external ear normal.     Nose: Nose normal.     Mouth/Throat:     Mouth: Mucous membranes are moist.     Pharynx: Oropharynx is clear.  Eyes:     Conjunctiva/sclera: Conjunctivae normal.     Pupils: Pupils are equal, round, and reactive to light.  Neck:     Thyroid: No thyromegaly.  Cardiovascular:     Rate and Rhythm: Normal rate and regular rhythm.     Heart sounds: Normal heart sounds. No murmur heard. Pulmonary:     Effort: Pulmonary effort is normal. No respiratory distress.     Breath sounds:  Normal breath sounds.  Abdominal:     General: There is no distension.     Palpations: Abdomen is soft. There is no mass.     Tenderness: There is no abdominal tenderness.  Musculoskeletal:        General: Normal range of motion.     Cervical back: Normal range of motion and neck supple.  Skin:    General: Skin is warm and dry.  Neurological:     General: No focal deficit present.     Mental Status: She is alert and oriented to person, place, and time.  Psychiatric:        Mood and Affect: Mood normal.        Behavior: Behavior normal.         Assessment & Plan:   1. Annual physical exam  - CMP14+EGFR  2. Screening for deficiency anemia  - CBC with Differential  3. Screening for lipid disorders  - Lipid Panel  4. Screening for endocrine/metabolic/immunity disorders  - Vitamin D, 25-hydroxy - TSH    Return in about 1 year (around 02/26/2023) for physical.   Becky Sax, MD

## 2022-04-27 ENCOUNTER — Other Ambulatory Visit (HOSPITAL_COMMUNITY): Payer: Self-pay

## 2022-04-29 DIAGNOSIS — Z005 Encounter for examination of potential donor of organ and tissue: Secondary | ICD-10-CM | POA: Diagnosis not present

## 2022-04-30 ENCOUNTER — Other Ambulatory Visit (HOSPITAL_COMMUNITY): Payer: Self-pay

## 2022-05-02 ENCOUNTER — Encounter: Payer: Self-pay | Admitting: *Deleted

## 2022-06-02 ENCOUNTER — Other Ambulatory Visit: Payer: Self-pay | Admitting: Family Medicine

## 2022-06-02 DIAGNOSIS — Z1231 Encounter for screening mammogram for malignant neoplasm of breast: Secondary | ICD-10-CM

## 2022-06-24 ENCOUNTER — Ambulatory Visit: Payer: Commercial Managed Care - PPO

## 2022-06-24 ENCOUNTER — Ambulatory Visit
Admission: RE | Admit: 2022-06-24 | Discharge: 2022-06-24 | Disposition: A | Discharge status: Home / Self Care | Payer: Commercial Managed Care - PPO | Source: Ambulatory Visit | Attending: Family Medicine | Admitting: Family Medicine

## 2022-06-24 DIAGNOSIS — Z1231 Encounter for screening mammogram for malignant neoplasm of breast: Secondary | ICD-10-CM | POA: Diagnosis not present

## 2022-06-28 ENCOUNTER — Telehealth: Payer: Self-pay

## 2022-06-28 NOTE — Telephone Encounter (Signed)
-----   Message from Guy Franco, RN sent at 06/28/2022  2:56 PM EDT -----  ----- Message ----- From: Rema Fendt, NP Sent: 06/28/2022  12:37 PM EDT To: Guy Franco, RN; Marcelle Overlie  No mammographic evidence of malignancy. Screening mammogram in one year.

## 2022-06-28 NOTE — Telephone Encounter (Signed)
Pt was called and vm was left, Information has been sent to nurse pool.   

## 2022-07-07 ENCOUNTER — Other Ambulatory Visit (HOSPITAL_COMMUNITY): Payer: Self-pay

## 2022-07-07 DIAGNOSIS — T8579XA Infection and inflammatory reaction due to other internal prosthetic devices, implants and grafts, initial encounter: Secondary | ICD-10-CM | POA: Insufficient documentation

## 2022-07-07 MED ORDER — CLINDAMYCIN HCL 300 MG PO CAPS
300.0000 mg | ORAL_CAPSULE | Freq: Three times a day (TID) | ORAL | 0 refills | Status: DC
  Filled 2022-07-07: qty 30, 10d supply, fill #0

## 2022-07-14 ENCOUNTER — Ambulatory Visit: Payer: Commercial Managed Care - PPO | Admitting: Family Medicine

## 2022-07-14 ENCOUNTER — Other Ambulatory Visit (HOSPITAL_COMMUNITY)
Admission: RE | Admit: 2022-07-14 | Discharge: 2022-07-14 | Disposition: A | Discharge status: Home / Self Care | Payer: Commercial Managed Care - PPO | Source: Ambulatory Visit | Attending: Family Medicine | Admitting: Family Medicine

## 2022-07-14 VITALS — BP 118/78 | HR 82 | Temp 97.8°F | Resp 16 | Wt 176.0 lb

## 2022-07-14 DIAGNOSIS — Z01419 Encounter for gynecological examination (general) (routine) without abnormal findings: Secondary | ICD-10-CM | POA: Insufficient documentation

## 2022-07-14 DIAGNOSIS — Z23 Encounter for immunization: Secondary | ICD-10-CM

## 2022-07-14 DIAGNOSIS — Z114 Encounter for screening for human immunodeficiency virus [HIV]: Secondary | ICD-10-CM

## 2022-07-15 LAB — CERVICOVAGINAL ANCILLARY ONLY
Bacterial Vaginitis (gardnerella): NEGATIVE
Candida Glabrata: NEGATIVE
Candida Vaginitis: NEGATIVE
Chlamydia: NEGATIVE
Comment: NEGATIVE
Comment: NEGATIVE
Comment: NEGATIVE
Comment: NEGATIVE
Comment: NEGATIVE
Comment: NORMAL
Neisseria Gonorrhea: NEGATIVE
Trichomonas: NEGATIVE

## 2022-07-18 LAB — CYTOLOGY - PAP
Adequacy: ABSENT
Diagnosis: NEGATIVE

## 2022-07-19 ENCOUNTER — Encounter: Payer: Self-pay | Admitting: Family Medicine

## 2022-07-19 NOTE — Progress Notes (Signed)
Established Patient Office Visit  Subjective    Patient ID: Kristen White, female    DOB: 05-06-1970  Age: 52 y.o. MRN: [id number]  CC: No chief complaint on file.   HPI Kristen White presents for routine gyn exam.    Outpatient Encounter Medications as of 07/14/2022  Medication Sig   acetaminophen (TYLENOL) 500 MG tablet Take by mouth.   cetirizine (ZYRTEC) 10 MG tablet Take 10 mg by mouth daily.   Cholecalciferol 50 MCG (2000 UT) TABS Take by mouth.   clindamycin (CLEOCIN) 300 MG capsule Take 1 capsule (300 mg total) by mouth 3 (three) times a day for 10 days.   COVID-19 mRNA vaccine 2023-2024 (COMIRNATY) syringe Inject into the muscle.   ibuprofen (ADVIL) 400 MG tablet Take 400 mg by mouth every 6 (six) hours as needed for mild pain.   lansoprazole (PREVACID) 30 MG capsule Take by mouth.   medroxyPROGESTERone Acetate 150 MG/ML SUSY Inject 1 mL (150 mg total) into the muscle every 3 (three) months.   methocarbamol (ROBAXIN) 500 MG tablet Take 1 tablet (500 mg total) by mouth every 8 (eight) hours as needed for muscle spasms.   Multiple Vitamin (MULTI-VITAMIN) tablet Take 1 tablet by mouth daily.   Multiple Vitamin (MULTI-VITAMIN) tablet Take 1 tablet by mouth daily.   No facility-administered encounter medications on file as of 07/14/2022.    Past Medical History:  Diagnosis Date   Allergy    Family history of colonic polyps    GERD (gastroesophageal reflux disease)    Heart murmur    PONV (postoperative nausea and vomiting)     Past Surgical History:  Procedure Laterality Date   DORSAL COMPARTMENT RELEASE Right 05/12/2021   Procedure: RIGHT RELEASE DORSAL COMPARTMENT (DEQUERVAIN);  Surgeon: Marlyne Beards, MD;  Location: [facility];  Service: Orthopedics;  Laterality: Right;   IMPLANTATION BONE ANCHORED HEARING AID Right 10/2019   INNER EAR SURGERY Right    TENOLYSIS FOREARM / WRIST     WISDOM TOOTH EXTRACTION      Family History  Problem  Relation Age of Onset   Early death Mother    Hypertension Father    Colon polyps Father    Alcohol abuse Maternal Uncle    Alcohol abuse Paternal Uncle    Colon polyps Paternal Uncle    Colon cancer Neg Hx    Stomach cancer Neg Hx    Rectal cancer Neg Hx    Esophageal cancer Neg Hx     Social History   Socioeconomic History   Marital status: Single    Spouse name: Not on file   Number of children: Not on file   Years of education: Not on file   Highest education level: Master's degree (e.g., MA, MS, MEng, MEd, MSW, MBA)  Occupational History   Occupation: Leisure centre manager  Tobacco Use   Smoking status: Never   Smokeless tobacco: Never  Vaping Use   Vaping Use: Never used  Substance and Sexual Activity   Alcohol use: Yes    Comment: rare   Drug use: Never   Sexual activity: Not Currently    Birth control/protection: Injection  Other Topics Concern   Not on file  Social History Narrative   Not on file   Social Determinants of Health   Financial Resource Strain: Low Risk  (07/14/2022)   Overall Financial Resource Strain (CARDIA)    Difficulty of Paying Living Expenses: Not hard at all  Food Insecurity: No Food Insecurity (  07/14/2022)   Hunger Vital Sign    Worried About Running Out of Food in the Last Year: Never true    Ran Out of Food in the Last Year: Never true  Transportation Needs: No Transportation Needs (07/14/2022)   PRAPARE - Administrator, Civil Service (Medical): No    Lack of Transportation (Non-Medical): No  Physical Activity: Sufficiently Active (07/14/2022)   Exercise Vital Sign    Days of Exercise per Week: 6 days    Minutes of Exercise per Session: 70 min  Stress: No Stress Concern Present (07/14/2022)   [employer] of Occupational Health - Occupational Stress Questionnaire    Feeling of Stress : Not at all  Social Connections: Moderately Integrated (07/14/2022)   Social Connection and Isolation Panel [NHANES]    Frequency of  Communication with Friends and Family: More than three times a week    Frequency of Social Gatherings with Friends and Family: Once a week    Attends Religious Services: More than 4 times per year    Active Member of [employer] or Organizations: Yes    Attends Engineer, structural: More than 4 times per year    Marital Status: Never married  Intimate Partner Violence: Not on file    Review of Systems  All other systems reviewed and are negative.       Objective    BP 118/78   Pulse 82   Temp 97.8 F (36.6 C) (Oral)   Resp 16   Wt 176 lb (79.8 kg)   SpO2 98%   BMI 29.29 kg/m   Physical Exam Vitals and nursing note reviewed.  Constitutional:      General: She is not in acute distress. Cardiovascular:     Rate and Rhythm: Normal rate and regular rhythm.  Pulmonary:     Effort: Pulmonary effort is normal.     Breath sounds: Normal breath sounds.  Genitourinary:    Exam position: Supine.     Labia:        Right: No lesion.        Left: No lesion.      Vagina: Normal.     Cervix: Normal.     Uterus: Normal.      Adnexa: Right adnexa normal and left adnexa normal.     Rectum: Normal.  Lymphadenopathy:     Lower Body: No right inguinal adenopathy. No left inguinal adenopathy.  Neurological:     General: No focal deficit present.     Mental Status: She is alert and oriented to person, place, and time.         Assessment & Plan:   1. Pap smear, as part of routine gynecological examination  - Cervicovaginal ancillary only - Cytology - PAP  2. Need for Tdap vaccination  - Tdap vaccine greater than or equal to 7yo IM  3. Need for shingles vaccine  - Varicella-zoster vaccine IM  4. Screening for HIV (human immunodeficiency virus)     No follow-ups on file.   Tommie Raymond, MD

## 2022-07-22 DIAGNOSIS — T8579XA Infection and inflammatory reaction due to other internal prosthetic devices, implants and grafts, initial encounter: Secondary | ICD-10-CM | POA: Diagnosis not present

## 2022-07-28 ENCOUNTER — Other Ambulatory Visit (HOSPITAL_COMMUNITY): Payer: Self-pay

## 2022-07-28 ENCOUNTER — Other Ambulatory Visit: Payer: Self-pay | Admitting: Family Medicine

## 2022-07-28 DIAGNOSIS — Z3042 Encounter for surveillance of injectable contraceptive: Secondary | ICD-10-CM

## 2022-07-28 NOTE — Telephone Encounter (Signed)
Requested medications are due for refill today.  yes  Requested medications are on the active medications list.  yes  Last refill. 08/10/2021 1mL 3 rf  Future visit scheduled.   yes  Notes to clinic.  Medication not assigned a protocol. Please review for refill.    Requested Prescriptions  Pending Prescriptions Disp Refills   medroxyPROGESTERone Acetate 150 MG/ML SUSY 1 mL 3    Sig: Inject 1 mL (150 mg total) into the muscle every 3 (three) months.     Off-Protocol Failed - 07/28/2022 10:27 AM      Failed - Medication not assigned to a protocol, review manually.      Passed - Valid encounter within last 12 months    Recent Outpatient Visits           2 weeks ago Pap smear, as part of routine gynecological examination   [facility], MD   5 months ago Annual physical exam   [facility], MD   12 months ago Hyperlipidemia, unspecified hyperlipidemia type   [facility] [facility], MD   1 year ago Tommi Rumps Quervain's disease (tenosynovitis)   [place] [facility], MD   1 year ago Well woman exam (no gynecological exam)    [facility] at [facility], MD       Future Appointments             In 1 month Georganna Skeans, MD [facility]

## 2022-08-08 ENCOUNTER — Telehealth: Payer: Self-pay | Admitting: Family Medicine

## 2022-08-08 ENCOUNTER — Other Ambulatory Visit (HOSPITAL_COMMUNITY): Payer: Self-pay

## 2022-08-08 ENCOUNTER — Other Ambulatory Visit: Payer: Self-pay | Admitting: Family Medicine

## 2022-08-08 DIAGNOSIS — Z3042 Encounter for surveillance of injectable contraceptive: Secondary | ICD-10-CM

## 2022-08-08 NOTE — Telephone Encounter (Signed)
Called pt to schedule nurse visit for Depo shot per nurse Guy Franco; pt asked why does she need a nurse visit for a Depo shot. 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 Andrey Campanile has not had a problem with sending a prescription for Depo shot so she can administer herself. I told the pt that I will send a note to the provider's nurse for clarity. Pt will wait for phone call from nurse for clarity.

## 2022-08-08 NOTE — Telephone Encounter (Signed)
Pt calling again, this is since, July 11 for status of refill of injection, medroxyPROGESTERone Acetate 150 MG/ML SUSY . Please call back with status., just says unable to refill , no protocol

## 2022-08-08 NOTE — Telephone Encounter (Signed)
Requested medication (s) are due for refill today:Yes  Requested medication (s) are on the active medication list: Yes  Last refill:  08/10/21  Future visit scheduled: Yes  Notes to clinic:  Unable to refill due to no refill protocol for this medication.      Requested Prescriptions  Pending Prescriptions Disp Refills   medroxyPROGESTERone Acetate 150 MG/ML SUSY 1 mL 3    Sig: Inject 1 mL (150 mg total) into the muscle every 3 (three) months.     Off-Protocol Failed - 08/08/2022  1:58 PM      Failed - Medication not assigned to a protocol, review manually.      Passed - Valid encounter within last 12 months    Recent Outpatient Visits           3 weeks ago Pap smear, as part of routine gynecological examination   [facility] [facility], MD   5 months ago Annual physical exam   [place] [facility], MD   1 year ago Hyperlipidemia, unspecified hyperlipidemia type   [place] [facility], MD   1 year ago Tommi Rumps Quervain's disease (tenosynovitis)   [place] [facility], MD   1 year ago Well woman exam (no gynecological exam)    [facility] at [facility], MD       Future Appointments             In 1 month Georganna Skeans, MD [facility]

## 2022-08-08 NOTE — Telephone Encounter (Unsigned)
Copied from CRM [id number]. Topic: General - Other >> Aug 08, 2022 11:22 AM Kristen White wrote: Reason for CRM: The patient has called to follow up on their request for a Depo injection to be sent to their pharmacy on 07/28/22  The patient shares that they are roughly 5 days overdue for the injection   Please contact the patient further when possible

## 2022-08-09 ENCOUNTER — Encounter: Payer: Self-pay | Admitting: Family Medicine

## 2022-08-09 NOTE — Telephone Encounter (Signed)
I called patient to get clarity. Patient wanted her rx for medrogestone injection. Patient said that she usually gives herself the injections and  DR. Wilson usually refills. I let patient know that I will follow-up with provider on the next business day.

## 2022-08-09 NOTE — Telephone Encounter (Signed)
Provider aware of situation.

## 2022-08-11 ENCOUNTER — Encounter (HOSPITAL_COMMUNITY): Payer: Self-pay

## 2022-08-11 DIAGNOSIS — Z01818 Encounter for other preprocedural examination: Secondary | ICD-10-CM

## 2022-08-12 ENCOUNTER — Ambulatory Visit (INDEPENDENT_AMBULATORY_CARE_PROVIDER_SITE_OTHER): Payer: Commercial Managed Care - PPO

## 2022-08-12 DIAGNOSIS — Z3042 Encounter for surveillance of injectable contraceptive: Secondary | ICD-10-CM

## 2022-08-12 DIAGNOSIS — Z3202 Encounter for pregnancy test, result negative: Secondary | ICD-10-CM

## 2022-08-12 LAB — POCT URINE PREGNANCY: Preg Test, Ur: NEGATIVE

## 2022-08-12 MED ORDER — MEDROXYPROGESTERONE ACETATE 150 MG/ML IM SUSP
150.0000 mg | Freq: Once | INTRAMUSCULAR | Status: AC
Start: 2022-08-12 — End: 2022-08-12
  Administered 2022-08-12: 150 mg via INTRAMUSCULAR

## 2022-08-15 ENCOUNTER — Encounter (HOSPITAL_COMMUNITY): Payer: Self-pay

## 2022-08-15 DIAGNOSIS — Z01818 Encounter for other preprocedural examination: Secondary | ICD-10-CM

## 2022-08-16 ENCOUNTER — Other Ambulatory Visit (HOSPITAL_COMMUNITY): Payer: Self-pay | Admitting: Transplant

## 2022-08-16 DIAGNOSIS — Z01818 Encounter for other preprocedural examination: Secondary | ICD-10-CM

## 2022-08-17 ENCOUNTER — Other Ambulatory Visit (HOSPITAL_COMMUNITY): Payer: Self-pay

## 2022-08-17 DIAGNOSIS — T8579XA Infection and inflammatory reaction due to other internal prosthetic devices, implants and grafts, initial encounter: Secondary | ICD-10-CM | POA: Diagnosis not present

## 2022-08-17 DIAGNOSIS — H9041 Sensorineural hearing loss, unilateral, right ear, with unrestricted hearing on the contralateral side: Secondary | ICD-10-CM | POA: Diagnosis not present

## 2022-08-17 MED ORDER — BETAMETHASONE DIPROPIONATE 0.05 % EX OINT
TOPICAL_OINTMENT | CUTANEOUS | 3 refills | Status: AC
  Filled 2022-08-17: qty 45, 30d supply, fill #0

## 2022-08-22 ENCOUNTER — Other Ambulatory Visit (HOSPITAL_COMMUNITY): Payer: Self-pay

## 2022-08-25 ENCOUNTER — Ambulatory Visit (HOSPITAL_COMMUNITY): Payer: Commercial Managed Care - PPO | Attending: Transplant

## 2022-08-25 DIAGNOSIS — Z01818 Encounter for other preprocedural examination: Secondary | ICD-10-CM | POA: Insufficient documentation

## 2022-08-25 DIAGNOSIS — Z01810 Encounter for preprocedural cardiovascular examination: Secondary | ICD-10-CM

## 2022-08-25 LAB — ECHOCARDIOGRAM COMPLETE
Area-P 1/2: 3.37 cm2
S' Lateral: 3 cm

## 2022-09-09 ENCOUNTER — Ambulatory Visit: Payer: Commercial Managed Care - PPO | Admitting: Family Medicine

## 2022-09-09 VITALS — BP 122/83 | HR 75 | Temp 98.1°F | Resp 16 | Wt 170.0 lb

## 2022-09-09 DIAGNOSIS — Z005 Encounter for examination of potential donor of organ and tissue: Secondary | ICD-10-CM

## 2022-09-09 DIAGNOSIS — Z23 Encounter for immunization: Secondary | ICD-10-CM

## 2022-09-12 ENCOUNTER — Encounter: Payer: Self-pay | Admitting: Family Medicine

## 2022-09-12 NOTE — Progress Notes (Signed)
Established Patient Office Visit  Subjective    Patient ID: EURA MICCO, female    DOB: Nov 22, 1970  Age: 52 y.o. MRN: [id number]  CC:  Chief Complaint  Patient presents with   Follow-up    HPI Kristen White presents for general follow up. Patient reports that she intends to be a living kidney donor for her father within the next couple of months.   Outpatient Encounter Medications as of 09/09/2022  Medication Sig   acetaminophen (TYLENOL) 500 MG tablet Take by mouth.   betamethasone dipropionate (DIPROLENE) 0.05 % ointment Apply to abutment site weekly or as needed.   cetirizine (ZYRTEC) 10 MG tablet Take 10 mg by mouth daily.   COVID-19 mRNA vaccine 2023-2024 (COMIRNATY) syringe Inject into the muscle.   ibuprofen (ADVIL) 400 MG tablet Take 400 mg by mouth every 6 (six) hours as needed for mild pain.   medroxyPROGESTERone Acetate 150 MG/ML SUSY Inject 1 mL (150 mg total) into the muscle every 3 (three) months.   methocarbamol (ROBAXIN) 500 MG tablet Take 1 tablet (500 mg total) by mouth every 8 (eight) hours as needed for muscle spasms.   [DISCONTINUED] Cholecalciferol 50 MCG (2000 UT) TABS Take by mouth.   [DISCONTINUED] clindamycin (CLEOCIN) 300 MG capsule Take 1 capsule (300 mg total) by mouth 3 (three) times a day for 10 days.   [DISCONTINUED] lansoprazole (PREVACID) 30 MG capsule Take by mouth.   [DISCONTINUED] Multiple Vitamin (MULTI-VITAMIN) tablet Take 1 tablet by mouth daily.   [DISCONTINUED] Multiple Vitamin (MULTI-VITAMIN) tablet Take 1 tablet by mouth daily.   No facility-administered encounter medications on file as of 09/09/2022.    Past Medical History:  Diagnosis Date   Allergy    Family history of colonic polyps    GERD (gastroesophageal reflux disease)    Heart murmur    PONV (postoperative nausea and vomiting)     Past Surgical History:  Procedure Laterality Date   DORSAL COMPARTMENT RELEASE Right 05/12/2021   Procedure: RIGHT RELEASE DORSAL  COMPARTMENT (DEQUERVAIN);  Surgeon: Marlyne Beards, MD;  Location: [facility];  Service: Orthopedics;  Laterality: Right;   IMPLANTATION BONE ANCHORED HEARING AID Right 10/2019   INNER EAR SURGERY Right    TENOLYSIS FOREARM / WRIST     WISDOM TOOTH EXTRACTION      Family History  Problem Relation Age of Onset   Early death Mother    Hypertension Father    Colon polyps Father    Alcohol abuse Maternal Uncle    Alcohol abuse Paternal Uncle    Colon polyps Paternal Uncle    Colon cancer Neg Hx    Stomach cancer Neg Hx    Rectal cancer Neg Hx    Esophageal cancer Neg Hx     Social History   Socioeconomic History   Marital status: Single    Spouse name: Not on file   Number of children: Not on file   Years of education: Not on file   Highest education level: Master's degree (e.g., MA, MS, MEng, MEd, MSW, MBA)  Occupational History   Occupation: Leisure centre manager  Tobacco Use   Smoking status: Never   Smokeless tobacco: Never  Vaping Use   Vaping status: Never Used  Substance and Sexual Activity   Alcohol use: Yes    Comment: rare   Drug use: Never   Sexual activity: Not Currently    Birth control/protection: Injection  Other Topics Concern   Not on file  Social History  Narrative   Not on file   Social Determinants of Health   Financial Resource Strain: Low Risk  (07/14/2022)   Overall Financial Resource Strain (CARDIA)    Difficulty of Paying Living Expenses: Not hard at all  Food Insecurity: No Food Insecurity (07/14/2022)   Hunger Vital Sign    Worried About Running Out of Food in the Last Year: Never true    Ran Out of Food in the Last Year: Never true  Transportation Needs: No Transportation Needs (07/14/2022)   PRAPARE - Administrator, Civil Service (Medical): No    Lack of Transportation (Non-Medical): No  Physical Activity: Sufficiently Active (07/14/2022)   Exercise Vital Sign    Days of Exercise per Week: 6 days    Minutes of  Exercise per Session: 70 min  Stress: No Stress Concern Present (07/14/2022)   [employer] of Occupational Health - Occupational Stress Questionnaire    Feeling of Stress : Not at all  Social Connections: Moderately Integrated (07/14/2022)   Social Connection and Isolation Panel [NHANES]    Frequency of Communication with Friends and Family: More than three times a week    Frequency of Social Gatherings with Friends and Family: Once a week    Attends Religious Services: More than 4 times per year    Active Member of [employer] or Organizations: Yes    Attends Banker Meetings: More than 4 times per year    Marital Status: Never married  Intimate Partner Violence: Unknown (06/03/2021)   Received from [facility]   HITS    Physically Hurt: Not on file    Insult or Talk Down To: Not on file    Threaten Physical Harm: Not on file    Scream or Curse: Not on file    Review of Systems  All other systems reviewed and are negative.       Objective    BP 122/83   Pulse 75   Temp 98.1 F (36.7 C) (Oral)   Resp 16   Wt 170 lb (77.1 kg)   SpO2 98%   BMI 28.29 kg/m   Physical Exam Vitals and nursing note reviewed.  Constitutional:      General: She is not in acute distress. Cardiovascular:     Rate and Rhythm: Normal rate and regular rhythm.  Pulmonary:     Effort: Pulmonary effort is normal.     Breath sounds: Normal breath sounds.  Abdominal:     Palpations: Abdomen is soft.     Tenderness: There is no abdominal tenderness.  Neurological:     General: No focal deficit present.     Mental Status: She is alert and oriented to person, place, and time.         Assessment & Plan:   1. Willing to be kidney donor Plans liver donation within 3 months in [place].   2. Need for shingles vaccine  - Varicella-zoster vaccine IM    No follow-ups on file.   Tommie Raymond, MD

## 2022-09-30 ENCOUNTER — Other Ambulatory Visit (HOSPITAL_COMMUNITY): Payer: Self-pay

## 2022-11-18 ENCOUNTER — Encounter: Payer: Self-pay | Admitting: Family Medicine

## 2023-01-19 ENCOUNTER — Encounter: Payer: Self-pay | Admitting: Family Medicine

## 2023-01-26 ENCOUNTER — Encounter: Payer: Commercial Managed Care - PPO | Admitting: Family Medicine

## 2023-02-28 ENCOUNTER — Encounter: Payer: Commercial Managed Care - PPO | Admitting: Family Medicine

## 2023-03-01 ENCOUNTER — Ambulatory Visit (INDEPENDENT_AMBULATORY_CARE_PROVIDER_SITE_OTHER): Payer: Commercial Managed Care - PPO | Admitting: Family Medicine

## 2023-03-01 ENCOUNTER — Encounter: Payer: Self-pay | Admitting: Family Medicine

## 2023-03-01 VITALS — BP 122/78 | HR 77 | Temp 99.2°F | Resp 16 | Ht 65.0 in | Wt 183.4 lb

## 2023-03-01 DIAGNOSIS — Z0000 Encounter for general adult medical examination without abnormal findings: Secondary | ICD-10-CM | POA: Diagnosis not present

## 2023-03-01 DIAGNOSIS — Z130 Encounter for screening for diseases of the blood and blood-forming organs and certain disorders involving the immune mechanism: Secondary | ICD-10-CM

## 2023-03-01 DIAGNOSIS — Z13220 Encounter for screening for lipoid disorders: Secondary | ICD-10-CM

## 2023-03-02 ENCOUNTER — Encounter: Payer: Self-pay | Admitting: Family Medicine

## 2023-03-02 ENCOUNTER — Other Ambulatory Visit: Payer: Self-pay | Admitting: *Deleted

## 2023-03-02 DIAGNOSIS — R944 Abnormal results of kidney function studies: Secondary | ICD-10-CM

## 2023-03-02 LAB — CBC WITH DIFFERENTIAL/PLATELET
Basophils Absolute: 0.1 10*3/uL (ref 0.0–0.2)
Basos: 1 %
EOS (ABSOLUTE): 0.2 10*3/uL (ref 0.0–0.4)
Eos: 4 %
Hematocrit: 40 % (ref 34.0–46.6)
Hemoglobin: 13 g/dL (ref 11.1–15.9)
Immature Grans (Abs): 0 10*3/uL (ref 0.0–0.1)
Immature Granulocytes: 0 %
Lymphocytes Absolute: 2.1 10*3/uL (ref 0.7–3.1)
Lymphs: 46 %
MCH: 28.1 pg (ref 26.6–33.0)
MCHC: 32.5 g/dL (ref 31.5–35.7)
MCV: 87 fL (ref 79–97)
Monocytes Absolute: 0.3 10*3/uL (ref 0.1–0.9)
Monocytes: 7 %
Neutrophils Absolute: 2 10*3/uL (ref 1.4–7.0)
Neutrophils: 42 %
Platelets: 322 10*3/uL (ref 150–450)
RBC: 4.62 x10E6/uL (ref 3.77–5.28)
RDW: 14.3 % (ref 11.7–15.4)
WBC: 4.6 10*3/uL (ref 3.4–10.8)

## 2023-03-02 LAB — CMP14+EGFR
ALT: 16 [IU]/L (ref 0–32)
AST: 23 [IU]/L (ref 0–40)
Albumin: 4.3 g/dL (ref 3.8–4.9)
Alkaline Phosphatase: 81 [IU]/L (ref 44–121)
BUN/Creatinine Ratio: 12 (ref 9–23)
BUN: 18 mg/dL (ref 6–24)
Bilirubin Total: 0.3 mg/dL (ref 0.0–1.2)
CO2: 21 mmol/L (ref 20–29)
Calcium: 9.4 mg/dL (ref 8.7–10.2)
Chloride: 104 mmol/L (ref 96–106)
Creatinine, Ser: 1.46 mg/dL — ABNORMAL HIGH (ref 0.57–1.00)
Globulin, Total: 2.9 g/dL (ref 1.5–4.5)
Glucose: 97 mg/dL (ref 70–99)
Potassium: 5.1 mmol/L (ref 3.5–5.2)
Sodium: 139 mmol/L (ref 134–144)
Total Protein: 7.2 g/dL (ref 6.0–8.5)
eGFR: 43 mL/min/{1.73_m2} — ABNORMAL LOW (ref >59)

## 2023-03-02 LAB — LIPID PANEL
Chol/HDL Ratio: 3.1 {ratio} (ref 0.0–4.4)
Cholesterol, Total: 234 mg/dL — ABNORMAL HIGH (ref 100–199)
HDL: 75 mg/dL (ref >39)
LDL Chol Calc (NIH): 147 mg/dL — ABNORMAL HIGH (ref 0–99)
Triglycerides: 71 mg/dL (ref 0–149)
VLDL Cholesterol Cal: 12 mg/dL (ref 5–40)

## 2023-03-02 NOTE — Progress Notes (Signed)
Established Patient Office Visit  Subjective    Patient ID: Kristen VOYTKO, female    DOB: 07-18-1970  Age: 53 y.o. MRN: [id number]  CC:  Chief Complaint  Patient presents with   Annual Exam    Referral to ortho for right knee and shoulder    HPI Kristen White presents routine annual exam. Patient denies acute complaints or concerns.   Outpatient Encounter Medications as of 03/01/2023  Medication Sig   acetaminophen (TYLENOL) 500 MG tablet Take by mouth.   betamethasone dipropionate (DIPROLENE) 0.05 % ointment Apply to abutment site weekly or as needed.   cetirizine (ZYRTEC) 10 MG tablet Take 10 mg by mouth daily.   methocarbamol (ROBAXIN) 500 MG tablet Take 1 tablet (500 mg total) by mouth every 8 (eight) hours as needed for muscle spasms.   COVID-19 mRNA vaccine 2023-2024 (COMIRNATY) syringe Inject into the muscle. (Patient not taking: Reported on 03/01/2023)   medroxyPROGESTERone Acetate 150 MG/ML SUSY Inject 1 mL (150 mg total) into the muscle every 3 (three) months. (Patient not taking: Reported on 03/01/2023)   [DISCONTINUED] ibuprofen (ADVIL) 400 MG tablet Take 400 mg by mouth every 6 (six) hours as needed for mild pain.   No facility-administered encounter medications on file as of 03/01/2023.    Past Medical History:  Diagnosis Date   Allergy    Family history of colonic polyps    GERD (gastroesophageal reflux disease)    Heart murmur    PONV (postoperative nausea and vomiting)     Past Surgical History:  Procedure Laterality Date   DORSAL COMPARTMENT RELEASE Right 05/12/2021   Procedure: RIGHT RELEASE DORSAL COMPARTMENT (DEQUERVAIN);  Surgeon: Marlyne Beards, MD;  Location: [facility];  Service: Orthopedics;  Laterality: Right;   IMPLANTATION BONE ANCHORED HEARING AID Right 10/2019   INNER EAR SURGERY Right    TENOLYSIS FOREARM / WRIST     WISDOM TOOTH EXTRACTION      Family History  Problem Relation Age of Onset   Early death Mother     Hypertension Father    Colon polyps Father    Alcohol abuse Maternal Uncle    Alcohol abuse Paternal Uncle    Colon polyps Paternal Uncle    Colon cancer Neg Hx    Stomach cancer Neg Hx    Rectal cancer Neg Hx    Esophageal cancer Neg Hx     Social History   Socioeconomic History   Marital status: Single    Spouse name: Not on file   Number of children: Not on file   Years of education: Not on file   Highest education level: Master's degree (e.g., MA, MS, MEng, MEd, MSW, MBA)  Occupational History   Occupation: Leisure centre manager  Tobacco Use   Smoking status: Never   Smokeless tobacco: Never  Vaping Use   Vaping status: Never Used  Substance and Sexual Activity   Alcohol use: Yes    Comment: rare   Drug use: Never   Sexual activity: Not Currently    Birth control/protection: Injection  Other Topics Concern   Not on file  Social History Narrative   Not on file   Social Drivers of Health   Financial Resource Strain: Low Risk  (02/28/2023)   Overall Financial Resource Strain (CARDIA)    Difficulty of Paying Living Expenses: Not hard at all  Food Insecurity: No Food Insecurity (02/28/2023)   Hunger Vital Sign    Worried About Running Out of Food in the  Last Year: Never true    Ran Out of Food in the Last Year: Never true  Transportation Needs: No Transportation Needs (02/28/2023)   PRAPARE - Administrator, Civil Service (Medical): No    Lack of Transportation (Non-Medical): No  Physical Activity: Sufficiently Active (02/28/2023)   Exercise Vital Sign    Days of Exercise per Week: 6 days    Minutes of Exercise per Session: 70 min  Stress: No Stress Concern Present (02/28/2023)   [employer] of Occupational Health - Occupational Stress Questionnaire    Feeling of Stress : Not at all  Social Connections: Moderately Integrated (02/28/2023)   Social Connection and Isolation Panel [NHANES]    Frequency of Communication with Friends and Family: More than three  times a week    Frequency of Social Gatherings with Friends and Family: Once a week    Attends Religious Services: More than 4 times per year    Active Member of [employer] or Organizations: Yes    Attends Banker Meetings: More than 4 times per year    Marital Status: Never married  Intimate Partner Violence: Not At Risk (10/26/2022)   Received from [facility]   Interpersonal Violence    Patient afraid of, threatened, hurt, or sexually abused by someone known to him/her: No    Review of Systems  All other systems reviewed and are negative.       Objective    BP 122/78 (BP Location: Right Arm, Patient Position: Sitting, Cuff Size: Normal)   Pulse 77   Temp 99.2 F (37.3 C) (Oral)   Resp 16   Ht 5\' 5"  (1.651 m)   Wt 183 lb 6.4 oz (83.2 kg)   SpO2 98%   BMI 30.52 kg/m   Physical Exam Vitals and nursing note reviewed.  Constitutional:      General: She is not in acute distress. HENT:     Head: Normocephalic and atraumatic.     Right Ear: Tympanic membrane, ear canal and external ear normal.     Left Ear: Tympanic membrane, ear canal and external ear normal.     Nose: Nose normal.     Mouth/Throat:     Mouth: Mucous membranes are moist.     Pharynx: Oropharynx is clear.  Eyes:     Conjunctiva/sclera: Conjunctivae normal.     Pupils: Pupils are equal, round, and reactive to light.  Neck:     Thyroid: No thyromegaly.  Cardiovascular:     Rate and Rhythm: Normal rate and regular rhythm.     Heart sounds: Normal heart sounds. No murmur heard. Pulmonary:     Effort: Pulmonary effort is normal. No respiratory distress.     Breath sounds: Normal breath sounds.  Abdominal:     General: There is no distension.     Palpations: Abdomen is soft. There is no mass.     Tenderness: There is no abdominal tenderness.  Musculoskeletal:        General: Normal range of motion.     Cervical back: Normal range of motion and neck supple.  Skin:    General: Skin  is warm and dry.  Neurological:     General: No focal deficit present.     Mental Status: She is alert and oriented to person, place, and time.  Psychiatric:        Mood and Affect: Mood normal.        Behavior: Behavior normal.  Assessment & Plan:   Annual physical exam -     CMP14+EGFR  Screening for deficiency anemia -     CBC with Differential/Platelet  Screening for lipid disorders -     Lipid panel     No follow-ups on file.   Tommie Raymond, MD

## 2023-03-06 ENCOUNTER — Encounter: Payer: Self-pay | Admitting: Family Medicine

## 2023-03-24 ENCOUNTER — Encounter: Payer: Self-pay | Admitting: Family Medicine

## 2023-03-29 ENCOUNTER — Other Ambulatory Visit: Payer: Self-pay | Admitting: Family Medicine

## 2023-03-29 ENCOUNTER — Telehealth: Payer: Self-pay | Admitting: Family Medicine

## 2023-03-29 DIAGNOSIS — G8929 Other chronic pain: Secondary | ICD-10-CM

## 2023-03-29 NOTE — Telephone Encounter (Signed)
 Patient called me back and I made her aware that MD Andrey Campanile put in for a referral today and they will be giving her an call for an appointment.

## 2023-03-29 NOTE — Telephone Encounter (Signed)
 I returned patient call and no one answered so I left a voice message to return my call

## 2023-03-29 NOTE — Telephone Encounter (Unsigned)
 Copied from CRM [id number]. Topic: Referral - Status >> Mar 29, 2023 10:06 AM Antwanette L wrote: Reason for CRM: Patient is calling to get an on a referral to see an orthopedic about her right knee. Patient can be contacted by phone at [phone]

## 2023-03-31 NOTE — Telephone Encounter (Signed)
 I called and spoke to patient and made her aware of the referral to orthopedics.  Patient sttaed "they called her today with an appointment."

## 2023-04-19 ENCOUNTER — Other Ambulatory Visit (INDEPENDENT_AMBULATORY_CARE_PROVIDER_SITE_OTHER): Payer: Self-pay

## 2023-04-19 ENCOUNTER — Ambulatory Visit: Admitting: Orthopaedic Surgery

## 2023-04-19 ENCOUNTER — Encounter: Payer: Self-pay | Admitting: Orthopaedic Surgery

## 2023-04-19 DIAGNOSIS — M25561 Pain in right knee: Secondary | ICD-10-CM | POA: Diagnosis not present

## 2023-04-19 DIAGNOSIS — M25511 Pain in right shoulder: Secondary | ICD-10-CM | POA: Diagnosis not present

## 2023-04-19 DIAGNOSIS — G8929 Other chronic pain: Secondary | ICD-10-CM

## 2023-04-19 NOTE — Progress Notes (Signed)
 Office Visit Note   Patient: Kristen White           Date of Birth: 03-20-1970           MRN: [id number] Visit Date: 04/19/2023              Requested by: Georganna Skeans, MD [address] [place],  [place] [address] PCP: Georganna Skeans, MD   Assessment & Plan: Visit Diagnoses:  1. Chronic pain of right knee   2. Chronic right shoulder pain     Plan: [name] is a 53 year old female with right knee pain and right shoulder pain.  In regards to the knee impression is probable minor degenerative medial meniscal tear.  Not reporting any mechanical symptoms.  Symptoms are not constant.  In regards to the shoulder impression is biceps tendinitis or tendinitis at the subscap.  Could have a little bit of impingement as well.  Again symptoms are mild and infrequent.  I recommend that she continue with activity as tolerated.  Questions encouraged and answered.  Follow-up as needed.  Follow-Up Instructions: No follow-ups on file.   Orders:  Orders Placed This Encounter  Procedures   XR KNEE 3 VIEW RIGHT   XR Shoulder Right   No orders of the defined types were placed in this encounter.     Procedures: No procedures performed   Clinical Data: No additional findings.   Subjective: Chief Complaint  Patient presents with   Right Knee - Pain   Right Shoulder - Pain    HPI Kristen White is a 53 year old female who comes in for evaluation of right knee and right shoulder pain.  Overall she reports that the symptoms are occasional and not severe.  She is mainly here for reassurance that there is nothing serious going on.  She works out 6 days a week and overall is a very active person.  For the knee she feels some slight medial sided knee pain.  Denies any rest pain mechanical symptoms swelling or constant pain.  For the right shoulder she reports occasional pain that is worse with terminal forward flexion.  Denies any radicular symptoms. Review of Systems   Constitutional: Negative.   HENT: Negative.    Eyes: Negative.   Respiratory: Negative.    Cardiovascular: Negative.   Endocrine: Negative.   Musculoskeletal: Negative.   Neurological: Negative.   Hematological: Negative.   Psychiatric/Behavioral: Negative.    All other systems reviewed and are negative.    Objective: Vital Signs: There were no vitals taken for this visit.  Physical Exam Vitals and nursing note reviewed.  Constitutional:      Appearance: She is well-developed.  HENT:     Head: Atraumatic.     Nose: Nose normal.  Eyes:     Extraocular Movements: Extraocular movements intact.  Cardiovascular:     Pulses: Normal pulses.  Pulmonary:     Effort: Pulmonary effort is normal.  Abdominal:     Palpations: Abdomen is soft.  Musculoskeletal:     Cervical back: Neck supple.  Skin:    General: Skin is warm.     Capillary Refill: Capillary refill takes less than 2 seconds.  Neurological:     Mental Status: She is alert. Mental status is at baseline.  Psychiatric:        Behavior: Behavior normal.        Thought Content: Thought content normal.        Judgment: Judgment normal.  Ortho Exam Exam of the right knee shows no joint effusion.  Normal range of motion.  Collaterals and cruciates are stable.  Slight medial joint line tenderness.  Negative McMurray.  Exam of the right shoulder shows full range of motion.  Slight discomfort with terminal forward flexion and resisted internal rotation.  No bicipital groove tenderness. Specialty Comments:  No specialty comments available.  Imaging: XR KNEE 3 VIEW RIGHT Result Date: 04/19/2023 X-rays of the right knee show minimal osteoarthritis with a small periarticular spurring of the medial compartment.  XR Shoulder Right Result Date: 04/19/2023 3 view xrays show no acute or structural abnormalities.  Slight curvature to the acromion.    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) 07/07/2022   Chronic pain of right knee 12/28/2021   De Quervain's tenosynovitis, right 03/05/2021   Lateral epicondylitis of right elbow 08/17/2020   Back spasm 01/31/2020   Sensorineural hearing loss (SNHL) of right ear 04/07/2017   Seasonal allergies 04/06/2017   Acid reflux 04/06/2017   Contraceptive management 08/07/2009   Encounter for routine gynecological examination 04/13/2009   Past Medical History:  Diagnosis Date   Allergy    Family history of colonic polyps    GERD (gastroesophageal reflux disease)    Heart murmur    PONV (postoperative nausea and vomiting)     Family History  Problem Relation Age of Onset   Early death Mother    Hypertension Father    Colon polyps Father    Alcohol abuse Maternal Uncle    Alcohol abuse Paternal Uncle    Colon polyps Paternal Uncle    Colon cancer Neg Hx    Stomach cancer Neg Hx    Rectal cancer Neg Hx    Esophageal cancer Neg Hx     Past Surgical History:  Procedure Laterality Date   DORSAL COMPARTMENT RELEASE Right 05/12/2021   Procedure: RIGHT RELEASE DORSAL COMPARTMENT (DEQUERVAIN);  Surgeon: Marlyne Beards, MD;  [facility];  Service: Orthopedics;  Laterality: Right;   IMPLANTATION BONE ANCHORED HEARING AID Right 10/2019   INNER EAR SURGERY Right    TENOLYSIS FOREARM / WRIST     WISDOM TOOTH EXTRACTION     Social History   Occupational History   Occupation: Leisure centre manager  Tobacco Use   Smoking status: Never   Smokeless tobacco: Never  Vaping Use   Vaping status: Never Used  Substance and Sexual Activity   Alcohol use: Yes    Comment: rare   Drug use: Never   Sexual activity: Not Currently    Birth control/protection: Injection

## 2023-06-01 ENCOUNTER — Other Ambulatory Visit: Payer: Self-pay | Admitting: Family Medicine

## 2023-06-01 DIAGNOSIS — Z1231 Encounter for screening mammogram for malignant neoplasm of breast: Secondary | ICD-10-CM

## 2023-06-30 ENCOUNTER — Ambulatory Visit
Admission: RE | Admit: 2023-06-30 | Discharge: 2023-06-30 | Disposition: A | Discharge status: Home / Self Care | Source: Ambulatory Visit | Attending: Family Medicine | Admitting: Family Medicine

## 2023-06-30 DIAGNOSIS — Z1231 Encounter for screening mammogram for malignant neoplasm of breast: Secondary | ICD-10-CM | POA: Diagnosis not present

## 2023-07-26 ENCOUNTER — Encounter: Payer: Self-pay | Admitting: *Deleted

## 2023-07-26 NOTE — Telephone Encounter (Signed)
 Message sent to patient

## 2023-07-28 NOTE — Telephone Encounter (Signed)
Ppd scheduled.

## 2023-08-01 ENCOUNTER — Ambulatory Visit (INDEPENDENT_AMBULATORY_CARE_PROVIDER_SITE_OTHER): Admitting: *Deleted

## 2023-08-01 DIAGNOSIS — Z111 Encounter for screening for respiratory tuberculosis: Secondary | ICD-10-CM

## 2023-08-02 NOTE — Progress Notes (Signed)
 Patient came in here for ppd given by Alyica,CMA

## 2023-08-03 ENCOUNTER — Ambulatory Visit: Payer: Self-pay

## 2023-08-03 ENCOUNTER — Ambulatory Visit: Payer: Self-pay | Admitting: Family Medicine

## 2023-08-03 LAB — TB SKIN TEST
Induration: 0 mm
TB Skin Test: NEGATIVE

## 2023-09-11 ENCOUNTER — Ambulatory Visit: Admitting: Family Medicine

## 2023-09-11 VITALS — BP 116/79 | HR 74 | Ht 65.0 in | Wt 184.2 lb

## 2023-09-11 DIAGNOSIS — E785 Hyperlipidemia, unspecified: Secondary | ICD-10-CM | POA: Diagnosis not present

## 2023-09-11 DIAGNOSIS — Z23 Encounter for immunization: Secondary | ICD-10-CM

## 2023-09-11 DIAGNOSIS — G4726 Circadian rhythm sleep disorder, shift work type: Secondary | ICD-10-CM

## 2023-09-11 DIAGNOSIS — Z780 Asymptomatic menopausal state: Secondary | ICD-10-CM | POA: Diagnosis not present

## 2023-09-11 NOTE — Progress Notes (Unsigned)
 Established Patient Office Visit  Subjective    Patient ID: Kristen White, female    DOB: 11-18-1970  Age: 53 y.o. MRN: [id number]  CC:  Chief Complaint  Patient presents with   Medical Management of Chronic Issues    Pt reports last time she was here she had her cholesterol checked and she was told to take fish oil. She would like a recheck on her cholesterol.     HPI Kristen White presents for follow up of hyperlipidemia. She also is concerned if she is in true menopause as she had been on hormonal therapy for a period of time. 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   Outpatient Encounter Medications as of 09/11/2023  Medication Sig   acetaminophen  (TYLENOL ) 500 MG tablet Take by mouth.   betamethasone  dipropionate (DIPROLENE ) 0.05 % ointment Apply to abutment site weekly or as needed.   cetirizine (ZYRTEC) 10 MG tablet Take 10 mg by mouth daily.   methocarbamol  (ROBAXIN ) 500 MG tablet Take 1 tablet (500 mg total) by mouth every 8 (eight) hours as needed for muscle spasms.   COVID-19 mRNA vaccine 2023-2024 (COMIRNATY ) syringe Inject into the muscle. (Patient not taking: Reported on 03/01/2023)   medroxyPROGESTERone  Acetate 150 MG/ML SUSY Inject 1 mL (150 mg total) into the muscle every 3 (three) months. (Patient not taking: Reported on 03/01/2023)   No facility-administered encounter medications on White as of 09/11/2023.    Past Medical History:  Diagnosis Date   Allergy    Family history of colonic polyps    GERD (gastroesophageal reflux disease)    Heart murmur    PONV (postoperative nausea and vomiting)     Past Surgical History:  Procedure Laterality Date   DORSAL COMPARTMENT RELEASE Right 05/12/2021   Procedure: RIGHT RELEASE DORSAL COMPARTMENT (DEQUERVAIN);  Surgeon: Romona Harari, MD;  Location: [facility];  Service: Orthopedics;  Laterality: Right;   IMPLANTATION BONE ANCHORED HEARING  AID Right 10/2019   INNER EAR SURGERY Right    TENOLYSIS FOREARM / WRIST     WISDOM TOOTH EXTRACTION      Family History  Problem Relation Age of Onset   Early death Mother    Hypertension Father    Colon polyps Father    Alcohol abuse Maternal Uncle    Alcohol abuse Paternal Uncle    Colon polyps Paternal Uncle    Colon cancer Neg Hx    Stomach cancer Neg Hx    Rectal cancer Neg Hx    Esophageal cancer Neg Hx     Social History   Socioeconomic History   Marital status: Single    Spouse name: Not on White   Number of children: Not on White   Years of education: Not on White   Highest education level: Master's degree (e.g., MA, MS, MEng, MEd, MSW, MBA)  Occupational History   Occupation: Leisure centre manager  Tobacco Use   Smoking status: Never   Smokeless tobacco: Never  Vaping Use   Vaping status: Never Used  Substance and Sexual Activity   Alcohol use: Yes    Comment: rare   Drug use: Never   Sexual activity: Not Currently    Birth control/protection: Injection  Other Topics Concern   Not on White  Social History Narrative   Not on White   Social Drivers of Health   Financial Resource Strain: Low Risk  (09/11/2023)   Overall Financial  Resource Strain (CARDIA)    Difficulty of Paying Living Expenses: Not hard at all  Food Insecurity: No Food Insecurity (09/11/2023)   Hunger Vital Sign    Worried About Running Out of Food in the Last Year: Never true    Ran Out of Food in the Last Year: Never true  Transportation Needs: No Transportation Needs (09/11/2023)   PRAPARE - Administrator, Civil Service (Medical): No    Lack of Transportation (Non-Medical): No  Physical Activity: Sufficiently Active (09/11/2023)   Exercise Vital Sign    Days of Exercise per Week: 6 days    Minutes of Exercise per Session: 60 min  Stress: No Stress Concern Present (09/11/2023)   [employer] of Occupational Health - Occupational Stress Questionnaire    Feeling of Stress: Only  a little  Social Connections: Moderately Integrated (09/11/2023)   Social Connection and Isolation Panel    Frequency of Communication with Friends and Family: More than three times a week    Frequency of Social Gatherings with Friends and Family: Once a week    Attends Religious Services: More than 4 times per year    Active Member of [employer] or Organizations: Yes    Attends Banker Meetings: More than 4 times per year    Marital Status: Never married  Intimate Partner Violence: Not At Risk (10/26/2022)   Received from [facility]   Interpersonal Violence    Patient afraid of, threatened, hurt, or sexually abused by someone known to him/her: No    Review of Systems  All other systems reviewed and are negative.       Objective    BP 116/79   Pulse 74   Ht 5' 5 (1.651 m)   Wt 184 lb 3.2 oz (83.6 kg)   SpO2 97%   BMI 30.65 kg/m   Physical Exam Vitals and nursing note reviewed.  Constitutional:      General: She is not in acute distress. Cardiovascular:     Rate and Rhythm: Normal rate and regular rhythm.  Pulmonary:     Effort: Pulmonary effort is normal.     Breath sounds: Normal breath sounds.  Abdominal:     Palpations: Abdomen is soft.     Tenderness: There is no abdominal tenderness.  Neurological:     General: No focal deficit present.     Mental Status: She is alert and oriented to person, place, and time.         Assessment & Plan:   1. 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   3. Menopause  - Follicle stimulating hormone - Estradiol   4. Need for immunization against influenza  - Pneumococcal conjugate vaccine 20-valent (Prevnar 20)  Return in about 6 months (around 03/13/2024) for follow up.   Tanda Raguel SQUIBB, MD

## 2023-09-12 ENCOUNTER — Encounter: Payer: Self-pay | Admitting: Family Medicine

## 2023-09-12 LAB — LIPID PANEL
Chol/HDL Ratio: 2.9 ratio (ref 0.0–4.4)
Cholesterol, Total: 205 mg/dL — ABNORMAL HIGH (ref 100–199)
HDL: 71 mg/dL (ref >39)
LDL Chol Calc (NIH): 121 mg/dL — ABNORMAL HIGH (ref 0–99)
Triglycerides: 73 mg/dL (ref 0–149)
VLDL Cholesterol Cal: 13 mg/dL (ref 5–40)

## 2023-09-12 LAB — ESTRADIOL: Estradiol: <5 pg/mL

## 2023-09-12 LAB — FOLLICLE STIMULATING HORMONE: FSH: 94.2 m[IU]/mL

## 2023-10-26 DIAGNOSIS — H9041 Sensorineural hearing loss, unilateral, right ear, with unrestricted hearing on the contralateral side: Secondary | ICD-10-CM | POA: Diagnosis not present

## 2023-11-20 ENCOUNTER — Encounter: Payer: Self-pay | Admitting: Radiology

## 2024-01-08 ENCOUNTER — Encounter: Payer: Self-pay | Admitting: Nurse Practitioner

## 2024-01-08 ENCOUNTER — Ambulatory Visit: Payer: Self-pay | Attending: Nurse Practitioner | Admitting: Nurse Practitioner

## 2024-01-08 VITALS — BP 126/79 | HR 66 | Ht 65.0 in | Wt 189.0 lb

## 2024-01-08 DIAGNOSIS — N907 Vulvar cyst: Secondary | ICD-10-CM

## 2024-01-08 NOTE — Progress Notes (Signed)
 "  Assessment & Plan:  Kristen White was seen today for cyst.  Diagnoses and all orders for this visit:  Labial cyst Apply warm compresses to area as needed.     Patient has been counseled on age-appropriate routine health concerns for screening and prevention. These are reviewed and up-to-date. Referrals have been placed accordingly. Immunizations are up-to-date or declined.    Subjective:   Chief Complaint  Patient presents with   Cyst    Now smaller in size.Located on inner upper left labia.  Slight pain first noticed a week ago when washing intimate area.     Kristen White 53 y.o. female presents to office today for vaginal cyst in the upper left labia.  She initially noticed the lesion approximately 9 days ago. Over the course of the past week the lesion has decreased in size however she wanted to come in to have it evaluated. No associated fever or drainage from the area.    Review of Systems  Constitutional: Negative.   Respiratory: Negative.    Cardiovascular: Negative.   Genitourinary:        SEE HPI  Skin: Negative.   Neurological: Negative.     Past Medical History:  Diagnosis Date   Allergy    Family history of colonic polyps    GERD (gastroesophageal reflux disease)    Heart murmur    PONV (postoperative nausea and vomiting)     Past Surgical History:  Procedure Laterality Date   DORSAL COMPARTMENT RELEASE Right 05/12/2021   Procedure: RIGHT RELEASE DORSAL COMPARTMENT (DEQUERVAIN);  Surgeon: Romona Harari, MD;  Location: [facility];  Service: Orthopedics;  Laterality: Right;   IMPLANTATION BONE ANCHORED HEARING AID Right 10/2019   INNER EAR SURGERY Right    TENOLYSIS FOREARM / WRIST     WISDOM TOOTH EXTRACTION      Family History  Problem Relation Age of Onset   Early death Mother    Hypertension Father    Colon polyps Father    Alcohol abuse Maternal Uncle    Alcohol abuse Paternal Uncle    Colon polyps Paternal Uncle     Colon cancer Neg Hx    Stomach cancer Neg Hx    Rectal cancer Neg Hx    Esophageal cancer Neg Hx     Social History Reviewed with no changes to be made today.   Outpatient Medications Prior to Visit  Medication Sig Dispense Refill   acetaminophen  (TYLENOL ) 500 MG tablet Take by mouth.     betamethasone  dipropionate (DIPROLENE ) 0.05 % ointment Apply to abutment site weekly or as needed. 45 g 3   cetirizine (ZYRTEC) 10 MG tablet Take 10 mg by mouth daily.     Omega-3 Fatty Acids (FISH OIL) 300 MG CAPS Take by mouth.     COVID-19 mRNA vaccine 2023-2024 (COMIRNATY ) syringe Inject into the muscle. (Patient not taking: Reported on 01/08/2024) 0.3 mL 0   medroxyPROGESTERone  Acetate 150 MG/ML SUSY Inject 1 mL (150 mg total) into the muscle every 3 (three) months. (Patient not taking: Reported on 01/08/2024) 1 mL 3   methocarbamol  (ROBAXIN ) 500 MG tablet Take 1 tablet (500 mg total) by mouth every 8 (eight) hours as needed for muscle spasms. (Patient not taking: Reported on 01/08/2024) 30 tablet 0   No facility-administered medications prior to visit.    Allergies[1]     Objective:    BP 126/79 (BP Location: Left Arm, Patient Position: Sitting, Cuff Size: Normal)   Pulse 66  Ht 5' 5 (1.651 m)   Wt 189 lb (85.7 kg)   SpO2 100%   BMI 31.45 kg/m  Wt Readings from Last 3 Encounters:  01/08/24 189 lb (85.7 kg)  09/11/23 184 lb 3.2 oz (83.6 kg)  03/01/23 183 lb 6.4 oz (83.2 kg)    Physical Exam Exam conducted with a chaperone present.  HENT:     Head: Normocephalic.  Cardiovascular:     Rate and Rhythm: Normal rate and regular rhythm.     Heart sounds: Normal heart sounds.  Pulmonary:     Effort: Pulmonary effort is normal.  Genitourinary:    Exam position: Prone.     Pubic Area: No rash.      Labia:        Left: Lesion present.       Comments: Labial cyst approximately 0.5cm in diameter. Slightly mobile and non tender to touch. No drainage or surrounding erythema present.   Neurological:     Mental Status: She is alert.          Patient has been counseled extensively about nutrition and exercise as well as the importance of adherence with medications and regular follow-up. The patient was given clear instructions to go to ER or return to medical center if symptoms don't improve, worsen or new problems develop. The patient verbalized understanding.   Follow-up: Return if symptoms worsen or fail to improve.   Haze LELON Servant, FNP-BC [facility] [place], [place] [phone]   01/08/2024, 11:01 AM    [1]  Allergies Allergen Reactions   Metronidazole  Other (See Comments) and Nausea Only    vertigo Other reaction(s): Other (See Comments) vertigo   "

## 2024-02-29 ENCOUNTER — Encounter: Payer: Commercial Managed Care - PPO | Admitting: Family Medicine
